# Patient Record
Sex: Female | Born: 1980 | Race: Black or African American | Hispanic: No | Marital: Single | Smoking: Never smoker
Health system: Southern US, Community
[De-identification: ages and names within clinical notes are randomized; demographics above are authoritative.]

## PROBLEM LIST (undated history)

## (undated) DIAGNOSIS — T4145XA Adverse effect of unspecified anesthetic, initial encounter: Secondary | ICD-10-CM

## (undated) DIAGNOSIS — R51 Headache: Secondary | ICD-10-CM

## (undated) DIAGNOSIS — T8859XA Other complications of anesthesia, initial encounter: Secondary | ICD-10-CM

## (undated) DIAGNOSIS — G4700 Insomnia, unspecified: Secondary | ICD-10-CM

## (undated) DIAGNOSIS — R112 Nausea with vomiting, unspecified: Secondary | ICD-10-CM

## (undated) DIAGNOSIS — Z9889 Other specified postprocedural states: Secondary | ICD-10-CM

## (undated) DIAGNOSIS — M8700 Idiopathic aseptic necrosis of unspecified bone: Secondary | ICD-10-CM

## (undated) HISTORY — PX: CHOLECYSTECTOMY: SHX55

## (undated) HISTORY — PX: TUBAL LIGATION: SHX77

## (undated) HISTORY — PX: APPENDECTOMY: SHX54

---

## 2006-06-05 ENCOUNTER — Emergency Department (HOSPITAL_COMMUNITY): Admission: EM | Admit: 2006-06-05 | Discharge: 2006-06-05 | Payer: Self-pay | Admitting: *Deleted

## 2007-07-05 ENCOUNTER — Encounter (INDEPENDENT_AMBULATORY_CARE_PROVIDER_SITE_OTHER): Payer: Self-pay | Admitting: Obstetrics and Gynecology

## 2007-07-05 ENCOUNTER — Ambulatory Visit: Payer: Self-pay | Admitting: Gynecology

## 2007-07-05 ENCOUNTER — Ambulatory Visit (HOSPITAL_COMMUNITY): Admission: RE | Admit: 2007-07-05 | Discharge: 2007-07-05 | Payer: Self-pay | Admitting: Obstetrics and Gynecology

## 2011-03-29 NOTE — Op Note (Signed)
NAMEKENNEDY, BRINES          ACCOUNT NO.:  [id number]   MEDICAL RECORD NO.:  [id number]          PATIENT TYPE:  AMB   LOCATION:  SDC                           FACILITY:  [facility]   PHYSICIAN:  Maxie Better, M.D.DATE OF BIRTH:  [birth year]   DATE OF PROCEDURE:  07/05/2007  DATE OF DISCHARGE:                               OPERATIVE REPORT   PREOPERATIVE DIAGNOSIS:  Persistent right lower quadrant pain.   PROCEDURE:  Diagnostic laparoscopy, laparoscopic appendectomy.   POSTOPERATIVE DIAGNOSIS:  Right lower quadrant pain.   ANESTHESIA:  General.   SURGEON:  Maxie Better, M.D.   ASSISTANT:  Ginger Carne, M.D.   INDICATIONS:  A 30 year old gravida 1, para 1 black female with last  menstrual period 2 days ago, history of tubal ligation with persistent  right lower quadrant pain which she had evaluation including a sonogram  as well as the CT scan x2 and evaluation by general surgeon who now  presents for surgical evaluation. Risk and benefit procedure is  explained to the patient including possible to removal of her appendix.  Consent was the patient was transferred to the operating room.   PROCEDURE:  Under adequate general anesthesia the patient is placed in  the dorsal lithotomy position.  Sterilely prepped and draped in usual  fashion.  The bladder catheterized for moderate amount of urine. A  bivalve was placed in the vagina.  Single-tooth tenaculum placed on the  anterior lip of the cervix and acorn cannula was introduced into the  cervical os and attached to the tenaculum for manipulation of the  uterus.  The bivalve speculum then removed.  Attention was then turned  to the abdomen. 0.25% Marcaine was injected infraumbilically.  An  infraumbilical incision was then made.  Veress needle was introduced,  tested, opening pressure of 2 was noted and 2.5 liters of carbon dioxide  was insufflated.  Veress needle was then removed.  A 10 mm disposable  trocar was  introduced into the abdomen without incident.  A lighted  video scope was introduced through that port. Suprapubic incision was  then made.  The 5 mm port was replaced under direct visualization into  the abdomen was without incident.  A probe was then utilized inferiorly  to assess the pelvis and upper abdomen.  The uterus was pushed  anteriorly. Posterior cul-de-sac, anterior cul-de-sac was without any  evidence of endometriosis.  Both ovaries were normal mobile.  No  evidence of herniation near the inguinal ligament area.  Both tubes  showed evidence of prior surgical interruption. Attention was then  turned to the upper abdomen and normal liver edge was seen.  The  appendix noted to its tip. At that point given the patient's ongoing  pain, decision made to remove the appendix in event that she may have  some acute appendicitis.  Dr. Mart Piggs assistance in this matter was  then obtained. At point he who had been in room and seen on video the  pelvis scrubbed in.  The suprapubic port was removed.  Two 5 mm ports  were placed in the left mid axillary line starting parallel to  umbilicus. The 5 mm ports were placed under direct visualization. The  appendix was grasped. The appendiceal artery was sequentially cauterized  and cut.  The vessel bleeding was cauterized, taking care not to damage  the actual appendix.  When the blood supply to the appendix was  ascertained the 0 PDS sutures were then placed at the base x3, two  proximally and one distally and the intervening segment was then cut.  The appendix was then placed in the Endobag and removed through the  umbilical port.  The pelvis was then irrigated and the fluid suctioned,  stump reinspected.  Good hemostasis remained. No spillage noted at which  time the procedure was felt to been complete. The pelvis was drained as  well. All inferior ports were then removed under direct visualization.  The infraumbilical port was also removed.   The abdomen deflated. The  infraumbilical site was closed with 0 Vicryl fascial stitch and  approximated 4-0 Vicryl subcuticular stitch.  The other sites were  closed with Dermabond.  The instruments in the vagina were removed.  Specimen was appendix sent to pathology with the specific of ruling out  subclinical appendicitis.  Estimated blood loss was minimal.  Complications none.  The patient tolerated the procedure well and was  transferred to recovery in stable condition.      Maxie Better, M.D.  Electronically Signed     [place]/MEDQ  D:  07/05/2007  T:  07/05/2007  Job:  [id number]

## 2011-06-16 ENCOUNTER — Encounter: Payer: Self-pay | Admitting: Podiatry

## 2011-08-26 LAB — CBC
HCT: 41.1
Hemoglobin: 14.3
RBC: 4.67
RDW: 12.8
WBC: 5.1

## 2012-07-12 ENCOUNTER — Other Ambulatory Visit: Payer: Self-pay | Admitting: Obstetrics & Gynecology

## 2012-07-12 ENCOUNTER — Other Ambulatory Visit (HOSPITAL_COMMUNITY)
Admission: RE | Admit: 2012-07-12 | Discharge: 2012-07-12 | Disposition: A | Discharge status: Home / Self Care | Payer: BC Managed Care – PPO | Source: Ambulatory Visit | Attending: Obstetrics & Gynecology | Admitting: Obstetrics & Gynecology

## 2012-07-12 DIAGNOSIS — Z1151 Encounter for screening for human papillomavirus (HPV): Secondary | ICD-10-CM | POA: Insufficient documentation

## 2012-07-12 DIAGNOSIS — Z124 Encounter for screening for malignant neoplasm of cervix: Secondary | ICD-10-CM | POA: Insufficient documentation

## 2012-07-12 DIAGNOSIS — Z113 Encounter for screening for infections with a predominantly sexual mode of transmission: Secondary | ICD-10-CM | POA: Insufficient documentation

## 2012-07-19 ENCOUNTER — Other Ambulatory Visit: Payer: Self-pay | Admitting: Obstetrics & Gynecology

## 2013-03-26 ENCOUNTER — Encounter (HOSPITAL_COMMUNITY): Payer: Self-pay | Admitting: Pharmacy Technician

## 2013-03-26 NOTE — Progress Notes (Signed)
Karen Macias/ Dr Charlann Boxer-   Need  PRE OP ORDERS PLEASE-  Has  PST appt 03/28/13   Thanks

## 2013-03-27 ENCOUNTER — Other Ambulatory Visit (HOSPITAL_COMMUNITY): Payer: Self-pay | Admitting: *Deleted

## 2013-03-28 ENCOUNTER — Encounter (HOSPITAL_COMMUNITY): Payer: Self-pay

## 2013-03-28 ENCOUNTER — Encounter (HOSPITAL_COMMUNITY)
Admission: RE | Admit: 2013-03-28 | Discharge: 2013-03-28 | Disposition: A | Discharge status: Home / Self Care | Payer: BC Managed Care – PPO | Source: Ambulatory Visit | Attending: Orthopedic Surgery | Admitting: Orthopedic Surgery

## 2013-03-28 DIAGNOSIS — M87059 Idiopathic aseptic necrosis of unspecified femur: Secondary | ICD-10-CM | POA: Insufficient documentation

## 2013-03-28 DIAGNOSIS — Z0183 Encounter for blood typing: Secondary | ICD-10-CM | POA: Insufficient documentation

## 2013-03-28 DIAGNOSIS — Z01812 Encounter for preprocedural laboratory examination: Secondary | ICD-10-CM | POA: Insufficient documentation

## 2013-03-28 HISTORY — DX: Idiopathic aseptic necrosis of unspecified bone: M87.00

## 2013-03-28 HISTORY — DX: Insomnia, unspecified: G47.00

## 2013-03-28 HISTORY — DX: Headache: R51

## 2013-03-28 LAB — URINALYSIS, ROUTINE W REFLEX MICROSCOPIC
Hgb urine dipstick: NEGATIVE
Leukocytes, UA: NEGATIVE
Protein, ur: NEGATIVE mg/dL
pH: 6 (ref 5.0–8.0)

## 2013-03-28 LAB — CBC
HCT: 38.7 % (ref 36.0–46.0)
MCH: 28.9 pg (ref 26.0–34.0)
MCHC: 34.1 g/dL (ref 30.0–36.0)
Platelets: 275 10*3/uL (ref 150–400)
WBC: 9.1 10*3/uL (ref 4.0–10.5)

## 2013-03-28 LAB — BASIC METABOLIC PANEL
CO2: 26 mEq/L (ref 19–32)
Calcium: 9.2 mg/dL (ref 8.4–10.5)
Chloride: 101 mEq/L (ref 96–112)
Creatinine, Ser: 0.55 mg/dL (ref 0.50–1.10)
Glucose, Bld: 85 mg/dL (ref 70–99)
Potassium: 3.7 mEq/L (ref 3.5–5.1)

## 2013-03-28 LAB — PROTIME-INR
INR: 1.02 (ref 0.00–1.49)
Prothrombin Time: 13.3 seconds (ref 11.6–15.2)

## 2013-03-28 LAB — HCG, SERUM, QUALITATIVE: Preg, Serum: NEGATIVE

## 2013-03-28 NOTE — Patient Instructions (Addendum)
Jakalyn Polinski  03/28/2013                           YOUR PROCEDURE IS SCHEDULED ON: 04/05/13               PLEASE REPORT TO SHORT STAY CENTER AT : 1:00 PM               CALL THIS NUMBER IF ANY PROBLEMS THE DAY OF SURGERY :               [phone]                      REMEMBER:   Do not eat food or drink liquids AFTER MIDNIGHT  May have clear liquids UNTIL 6 HOURS BEFORE SURGERY (10:00 AM)  Clear liquids include soda, tea, black coffee, apple or grape juice, broth.  Take these medicines the morning of surgery with A SIP OF WATER: TRAMADOL IF NEEDED   Do not wear jewelry, make-up   Do not wear lotions, powders, or perfumes.   Do not shave legs or underarms 12 hrs. before surgery (men may shave face)  Do not bring valuables to the hospital.  Contacts, dentures or bridgework may not be worn into surgery.  Leave suitcase in the car. After surgery it may be brought to your room.  For patients admitted to the hospital more than one night, checkout time is 11:00                          The day of discharge.   Patients discharged the day of surgery will not be allowed to drive home                             If going home same day of surgery, must have someone stay with you first                           24 hrs at home and arrange for some one to drive you home from hospital.    Special Instructions:   Please read over the following fact sheets that you were given:               1. MRSA  INFORMATION                      2. [place] PREPARING FOR SURGERY SHEET               3. INCENTIVE SPIROMETER                                                X_____________________________________________________________________        Failure to follow these instructions may result in cancellation of your surgery

## 2013-04-05 ENCOUNTER — Ambulatory Visit (HOSPITAL_COMMUNITY): Payer: BC Managed Care – PPO

## 2013-04-05 ENCOUNTER — Inpatient Hospital Stay (HOSPITAL_COMMUNITY)
Admission: RE | Admit: 2013-04-05 | Discharge: 2013-04-08 | DRG: 230 | Disposition: A | Discharge status: Home Health | Payer: BC Managed Care – PPO | Source: Ambulatory Visit | Attending: Orthopedic Surgery | Admitting: Orthopedic Surgery

## 2013-04-05 ENCOUNTER — Encounter (HOSPITAL_COMMUNITY)
Admission: RE | Disposition: A | Discharge status: Home Health | Payer: Self-pay | Source: Ambulatory Visit | Attending: Orthopedic Surgery

## 2013-04-05 ENCOUNTER — Encounter (HOSPITAL_COMMUNITY): Payer: Self-pay | Admitting: *Deleted

## 2013-04-05 ENCOUNTER — Ambulatory Visit (HOSPITAL_COMMUNITY): Payer: BC Managed Care – PPO | Admitting: Anesthesiology

## 2013-04-05 ENCOUNTER — Encounter (HOSPITAL_COMMUNITY): Payer: Self-pay | Admitting: Anesthesiology

## 2013-04-05 DIAGNOSIS — Z79899 Other long term (current) drug therapy: Secondary | ICD-10-CM

## 2013-04-05 DIAGNOSIS — M87051 Idiopathic aseptic necrosis of right femur: Secondary | ICD-10-CM

## 2013-04-05 DIAGNOSIS — Z6839 Body mass index (BMI) 39.0-39.9, adult: Secondary | ICD-10-CM

## 2013-04-05 DIAGNOSIS — R5381 Other malaise: Secondary | ICD-10-CM | POA: Diagnosis present

## 2013-04-05 DIAGNOSIS — M87059 Idiopathic aseptic necrosis of unspecified femur: Principal | ICD-10-CM | POA: Diagnosis present

## 2013-04-05 DIAGNOSIS — R5383 Other fatigue: Secondary | ICD-10-CM | POA: Diagnosis present

## 2013-04-05 DIAGNOSIS — Z01812 Encounter for preprocedural laboratory examination: Secondary | ICD-10-CM

## 2013-04-05 HISTORY — PX: DECOMPRESSION HIP-CORE: SHX5012

## 2013-04-05 LAB — TYPE AND SCREEN: Antibody Screen: NEGATIVE

## 2013-04-05 SURGERY — CORE DECOMPRESSION, FEMUR, HEAD
Anesthesia: General | Site: Hip | Laterality: Right | Wound class: Clean

## 2013-04-05 MED ORDER — LACTATED RINGERS IV SOLN
INTRAVENOUS | Status: DC | PRN
  Administered 2013-04-05 (×2): via INTRAVENOUS

## 2013-04-05 MED ORDER — SCOPOLAMINE 1 MG/3DAYS TD PT72
MEDICATED_PATCH | TRANSDERMAL | Status: AC
  Filled 2013-04-05: qty 1

## 2013-04-05 MED ORDER — LORATADINE 10 MG PO TABS
10.0000 mg | ORAL_TABLET | Freq: Every day | ORAL | Status: DC
  Administered 2013-04-06 – 2013-04-08 (×3): 10 mg via ORAL
  Filled 2013-04-05 (×4): qty 1

## 2013-04-05 MED ORDER — ACETAMINOPHEN 10 MG/ML IV SOLN
INTRAVENOUS | Status: AC
  Filled 2013-04-05: qty 100

## 2013-04-05 MED ORDER — KETOROLAC TROMETHAMINE 30 MG/ML IJ SOLN: 15.0000 mg | Freq: Once | INTRAMUSCULAR | Status: DC | PRN

## 2013-04-05 MED ORDER — LIDOCAINE HCL (CARDIAC) 20 MG/ML IV SOLN
INTRAVENOUS | Status: DC | PRN
  Administered 2013-04-05: 50 mg via INTRAVENOUS

## 2013-04-05 MED ORDER — ASPIRIN EC 325 MG PO TBEC: 325.0000 mg | DELAYED_RELEASE_TABLET | Freq: Two times a day (BID) | ORAL | Status: DC

## 2013-04-05 MED ORDER — METHOCARBAMOL 500 MG PO TABS: 500.0000 mg | ORAL_TABLET | Freq: Four times a day (QID) | ORAL | Status: DC

## 2013-04-05 MED ORDER — ONDANSETRON HCL 4 MG/2ML IJ SOLN
INTRAMUSCULAR | Status: DC | PRN
  Administered 2013-04-05: 4 mg via INTRAVENOUS

## 2013-04-05 MED ORDER — ONDANSETRON HCL 4 MG/2ML IJ SOLN
4.0000 mg | Freq: Four times a day (QID) | INTRAMUSCULAR | Status: DC | PRN
  Administered 2013-04-05: 4 mg via INTRAVENOUS
  Filled 2013-04-05: qty 2

## 2013-04-05 MED ORDER — PROPOFOL 10 MG/ML IV BOLUS
INTRAVENOUS | Status: DC | PRN
  Administered 2013-04-05: 200 mg via INTRAVENOUS

## 2013-04-05 MED ORDER — CEFAZOLIN SODIUM-DEXTROSE 2-3 GM-% IV SOLR
2.0000 g | INTRAVENOUS | Status: AC
  Administered 2013-04-05: 2 g via INTRAVENOUS

## 2013-04-05 MED ORDER — FENTANYL CITRATE 0.05 MG/ML IJ SOLN
INTRAMUSCULAR | Status: DC | PRN
  Administered 2013-04-05: 100 ug via INTRAVENOUS
  Administered 2013-04-05: 50 ug via INTRAVENOUS
  Administered 2013-04-05: 100 ug via INTRAVENOUS

## 2013-04-05 MED ORDER — HYDROMORPHONE HCL PF 1 MG/ML IJ SOLN
INTRAMUSCULAR | Status: AC
  Filled 2013-04-05: qty 1

## 2013-04-05 MED ORDER — DEXAMETHASONE SODIUM PHOSPHATE 4 MG/ML IJ SOLN
INTRAMUSCULAR | Status: DC | PRN
  Administered 2013-04-05: 4 mg via INTRAVENOUS

## 2013-04-05 MED ORDER — SCOPOLAMINE 1 MG/3DAYS TD PT72
MEDICATED_PATCH | TRANSDERMAL | Status: DC | PRN
  Administered 2013-04-05: 1 via TRANSDERMAL

## 2013-04-05 MED ORDER — 0.9 % SODIUM CHLORIDE (POUR BTL) OPTIME
TOPICAL | Status: DC | PRN
  Administered 2013-04-05: 1000 mL

## 2013-04-05 MED ORDER — ONDANSETRON HCL 4 MG PO TABS
4.0000 mg | ORAL_TABLET | Freq: Four times a day (QID) | ORAL | Status: DC | PRN
  Administered 2013-04-08: 4 mg via ORAL
  Filled 2013-04-05: qty 1

## 2013-04-05 MED ORDER — METOCLOPRAMIDE HCL 5 MG/ML IJ SOLN
5.0000 mg | Freq: Three times a day (TID) | INTRAMUSCULAR | Status: DC | PRN
  Administered 2013-04-05 – 2013-04-06 (×2): 10 mg via INTRAVENOUS
  Filled 2013-04-05 (×3): qty 2

## 2013-04-05 MED ORDER — HYDROMORPHONE HCL PF 1 MG/ML IJ SOLN
INTRAMUSCULAR | Status: DC | PRN
  Administered 2013-04-05: 2 mg via INTRAVENOUS

## 2013-04-05 MED ORDER — HYDROMORPHONE HCL PF 1 MG/ML IJ SOLN
0.2500 mg | INTRAMUSCULAR | Status: DC | PRN
  Administered 2013-04-05 (×4): 0.5 mg via INTRAVENOUS

## 2013-04-05 MED ORDER — METOCLOPRAMIDE HCL 10 MG PO TABS: 5.0000 mg | ORAL_TABLET | Freq: Three times a day (TID) | ORAL | Status: DC | PRN

## 2013-04-05 MED ORDER — CHLORHEXIDINE GLUCONATE 4 % EX LIQD: 60.0000 mL | Freq: Once | CUTANEOUS | Status: DC

## 2013-04-05 MED ORDER — METOCLOPRAMIDE HCL 5 MG/ML IJ SOLN
INTRAMUSCULAR | Status: DC | PRN
  Administered 2013-04-05: 10 mg via INTRAVENOUS

## 2013-04-05 MED ORDER — HYDROCODONE-ACETAMINOPHEN 5-325 MG PO TABS
1.0000 | ORAL_TABLET | ORAL | Status: DC | PRN
  Administered 2013-04-05: 2 via ORAL
  Administered 2013-04-05: 1 via ORAL
  Administered 2013-04-06 – 2013-04-08 (×9): 2 via ORAL
  Filled 2013-04-05 (×11): qty 2

## 2013-04-05 MED ORDER — HYDROCODONE-ACETAMINOPHEN 5-325 MG PO TABS: 1.0000 | ORAL_TABLET | ORAL | Status: DC | PRN

## 2013-04-05 MED ORDER — MIDAZOLAM HCL 5 MG/5ML IJ SOLN
INTRAMUSCULAR | Status: DC | PRN
  Administered 2013-04-05: 2 mg via INTRAVENOUS

## 2013-04-05 MED ORDER — ASPIRIN EC 325 MG PO TBEC
325.0000 mg | DELAYED_RELEASE_TABLET | Freq: Two times a day (BID) | ORAL | Status: DC
  Administered 2013-04-06 – 2013-04-08 (×5): 325 mg via ORAL
  Filled 2013-04-05 (×6): qty 1

## 2013-04-05 MED ORDER — PROMETHAZINE HCL 25 MG/ML IJ SOLN: 6.2500 mg | INTRAMUSCULAR | Status: DC | PRN

## 2013-04-05 MED ORDER — CEFAZOLIN SODIUM-DEXTROSE 2-3 GM-% IV SOLR
INTRAVENOUS | Status: AC
  Filled 2013-04-05: qty 50

## 2013-04-05 MED ORDER — SODIUM CHLORIDE 0.9 % IV SOLN
INTRAVENOUS | Status: DC
  Administered 2013-04-05: 19:00:00 via INTRAVENOUS
  Administered 2013-04-06: 50 mL/h via INTRAVENOUS

## 2013-04-05 MED ORDER — LACTATED RINGERS IV SOLN
INTRAVENOUS | Status: DC
  Administered 2013-04-05: 1000 mL via INTRAVENOUS

## 2013-04-05 SURGICAL SUPPLY — 43 items
BAG SPEC THK2 15X12 ZIP CLS (MISCELLANEOUS)
BAG ZIPLOCK 12X15 (MISCELLANEOUS) IMPLANT
CLOTH BEACON ORANGE TIMEOUT ST (SAFETY) ×2 IMPLANT
DERMABOND ADVANCED (GAUZE/BANDAGES/DRESSINGS) ×1
DERMABOND ADVANCED .7 DNX12 (GAUZE/BANDAGES/DRESSINGS) ×1 IMPLANT
DRAPE ORTHO SPLIT 77X108 STRL (DRAPES)
DRAPE POUCH INSTRU U-SHP 10X18 (DRAPES) ×2 IMPLANT
DRAPE STERI IOBAN 125X83 (DRAPES) ×2 IMPLANT
DRAPE SURG 17X11 SM STRL (DRAPES) IMPLANT
DRAPE SURG ORHT 6 SPLT 77X108 (DRAPES) IMPLANT
DRAPE U-SHAPE 47X51 STRL (DRAPES) IMPLANT
DRSG AQUACEL AG ADV 3.5X 6 (GAUZE/BANDAGES/DRESSINGS) ×2 IMPLANT
DRSG AQUACEL AG ADV 3.5X10 (GAUZE/BANDAGES/DRESSINGS) IMPLANT
DRSG TEGADERM 4X4.75 (GAUZE/BANDAGES/DRESSINGS) IMPLANT
DURAPREP 26ML APPLICATOR (WOUND CARE) ×2 IMPLANT
ELECT REM PT RETURN 9FT ADLT (ELECTROSURGICAL) ×2
ELECTRODE REM PT RTRN 9FT ADLT (ELECTROSURGICAL) ×1 IMPLANT
EVACUATOR 1/8 PVC DRAIN (DRAIN) IMPLANT
GAUZE SPONGE 2X2 8PLY STRL LF (GAUZE/BANDAGES/DRESSINGS) IMPLANT
GLOVE BIOGEL PI IND STRL 7.5 (GLOVE) ×1 IMPLANT
GLOVE BIOGEL PI IND STRL 8 (GLOVE) ×1 IMPLANT
GLOVE BIOGEL PI INDICATOR 7.5 (GLOVE) ×1
GLOVE BIOGEL PI INDICATOR 8 (GLOVE) ×1
GLOVE ECLIPSE 8.0 STRL XLNG CF (GLOVE) ×2 IMPLANT
GLOVE ORTHO TXT STRL SZ7.5 (GLOVE) ×6 IMPLANT
GLOVE SURG SS PI 7.5 STRL IVOR (GLOVE) ×4 IMPLANT
GOWN BRE IMP PREV XXLGXLNG (GOWN DISPOSABLE) IMPLANT
GOWN STRL NON-REIN LRG LVL3 (GOWN DISPOSABLE) IMPLANT
KIT BASIN OR (CUSTOM PROCEDURE TRAY) ×2 IMPLANT
MANIFOLD NEPTUNE II (INSTRUMENTS) ×2 IMPLANT
NS IRRIG 1000ML POUR BTL (IV SOLUTION) ×2 IMPLANT
PACK TOTAL JOINT (CUSTOM PROCEDURE TRAY) IMPLANT
PIN THREADED GUIDE ACE (PIN) ×2 IMPLANT
POSITIONER SURGICAL ARM (MISCELLANEOUS) ×4 IMPLANT
SPONGE GAUZE 2X2 STER 10/PKG (GAUZE/BANDAGES/DRESSINGS)
SUT MNCRL AB 4-0 PS2 18 (SUTURE) ×2 IMPLANT
SUT VIC AB 0 CT1 27 (SUTURE) ×1
SUT VIC AB 0 CT1 27XBRD ANTBC (SUTURE) ×1 IMPLANT
SUT VIC AB 1 CT1 36 (SUTURE) ×2 IMPLANT
SUT VIC AB 2-0 CT1 27 (SUTURE) ×1
SUT VIC AB 2-0 CT1 27XBRD (SUTURE) ×1 IMPLANT
TOWEL OR 17X26 10 PK STRL BLUE (TOWEL DISPOSABLE) ×2 IMPLANT
TRAY FOLEY CATH 14FRSI W/METER (CATHETERS) IMPLANT

## 2013-04-05 NOTE — Transfer of Care (Signed)
Immediate Anesthesia Transfer of Care Note  Patient: Karen Macias  Procedure(s) Performed: Procedure(s): RIGHT HIP CORE - DECOMPRESSION (Right)  Patient Location: PACU  Anesthesia Type:General  Level of Consciousness: awake and alert   Airway & Oxygen Therapy: Patient Spontanous Breathing and Patient connected to face mask oxygen  Post-op Assessment: Report given to PACU RN and Post -op Vital signs reviewed and stable  Post vital signs: Reviewed and stable  Complications: No apparent anesthesia complications

## 2013-04-05 NOTE — Anesthesia Postprocedure Evaluation (Signed)
Anesthesia Post Note  Patient: Engineer, drilling  Procedure(s) Performed: Procedure(s) (LRB): RIGHT HIP CORE - DECOMPRESSION (Right)  Anesthesia type: General  Patient location: PACU  Post pain: Pain level controlled  Post assessment: Post-op Vital signs reviewed  Last Vitals: BP 126/57  Pulse 79  Temp(Src) 36.6 C (Oral)  Resp 12  SpO2 100%  LMP 03/08/2013  Post vital signs: Reviewed  Level of consciousness: sedated  Complications: No apparent anesthesia complications
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
## 2013-04-05 NOTE — Brief Op Note (Signed)
04/05/2013  3:38 PM  PATIENT:  Tonia Ghent  32 y.o. female  PRE-OPERATIVE DIAGNOSIS:  Right Hip Avascular Necrosis  POST-OPERATIVE DIAGNOSIS:  RIGHT HIP AVASCULAR  NECROSIS without collapse  PROCEDURE:  Procedure(s): RIGHT HIP CORE - DECOMPRESSION (Right)  SURGEON:  Surgeon(s) and Role:    * Shelda Pal, MD - Primary  PHYSICIAN ASSISTANT: Lanney Gins, PA-C  ANESTHESIA:   general  EBL:     BLOOD ADMINISTERED:none  DRAINS: none   LOCAL MEDICATIONS USED:  NONE  SPECIMEN:  No Specimen  DISPOSITION OF SPECIMEN:  N/A  COUNTS:  YES  TOURNIQUET:  * No tourniquets in log *  DICTATION: .Other Dictation: Dictation Number [id number]  PLAN OF CARE: Admit for overnight observation  PATIENT DISPOSITION:  PACU - hemodynamically stable.   Delay start of Pharmacological VTE agent (>24hrs) due to surgical blood loss or risk of bleeding: not applicable

## 2013-04-05 NOTE — Op Note (Signed)
Karen Macias, Karen Macias          ACCOUNT NO.:  [id number]  MEDICAL RECORD NO.:  [id number]  LOCATION:  1605                         FACILITY:  [facility]  PHYSICIAN:  Madlyn Frankel. Charlann Boxer, M.D.  DATE OF BIRTH:  1981-01-18  DATE OF PROCEDURE:  04/05/2013 DATE OF DISCHARGE:                              OPERATIVE REPORT   PREOPERATIVE DIAGNOSIS:  Right hip avascular necrosis without collapse.  POSTOPERATIVE DIAGNOSIS:  Right hip avascular necrosis without collapse.  PROCEDURE:  Right hip core decompression.  SURGEON:  Madlyn Frankel. Charlann Boxer, M.D.  ASSISTANT:  Lanney Gins, PA-C.  Note that, Mr. Karen Macias was present for the entirety of the case from preoperative positioning, management of soft tissues throughout the case, primary wound closure, and general facilitation of the case.  ANESTHESIA:  General.  SPECIMENS:  None.  DRAINS:  None.  COMPLICATIONS:  None apparent.  BLOOD LOSS:  Less than 25 mL.  INDICATIONS FOR PROCEDURE:  Karen Macias is a 32 year old female who presented to the office for evaluation of right hip pain.  She had, had an injury where she slipped and fell.  She had radiographic evaluation and had indicated concerns for a lesion of avascular necrosis on the anterior and lateral aspects of the femoral head.  Radiographically on plain films, there was no evidence of collapse, but MRI had indicated this small lesion, avascular necrosis.  After reviewing with her treatment options of observation versus surgical treatment, she wished at this point to proceed with surgical intervention.  Given the fact she did not have collapse and relatively small associated lesion probably 25% of the femoral head or less, we discussed core decompression versus some sort of revascularized fibular graft and definitely total hip arthroplasty.  Risks and benefits of the procedure were discussed and minimal risks of infection, DVT, more important was discussion of potential progression of  arthritis potentially for future surgery including total hip arthroplasty. Consent was obtained for benefit of pain relief.  PROCEDURE IN DETAIL:  The patient was brought to the operative theater. Once adequate anesthesia, preoperative antibiotics administered, Ancef. She was positioned supine on the fracture table.  Her left hip was flexed and abducted out of the way with bony prominences padded.  The right foot was placed into traction boot.  There was no traction applied.  At this point once she was adequately positioned with bony prominences padded, the right hip was prepped and draped in a sterile fashion from the iliac crest to the knee area.  The shower curtain dressing was applied.  At this point, we used fluoroscopy to identify the landmarks of the hip.  This was confirmed on AP and lateral.  Once the radiographs were obtained, I at this point used a guidewire in set orientation.  Landmarks identified in her hip.  Karen Macias has a very large lateral hip mass of soft tissue and fat.  I then made an incision laterally on the thigh and placed a guide pin along the lateral cortex at the level just proximal to the lesser trochanter.  Then under fluoroscopic imaging, I was careful to place the guidewire into the anterior, lateral aspect of her femoral head.  We had MRI images upon the screen to identify  the location and confirm.  Once I had the guidewire inserted in this area, we used a reamer for DHS screw, which was about 9 mm in diameter.  I then used a guidewire and drilled under fluoroscopy including continuous fluoroscopy to prevent any complications with the pin penetration.  Once this was cored, I took radiographs of the pictures of the core and location.  I then removed the drill and I then confirmed that there was no penetration using the guide wire.  Once this was done and this area decompressed, I irrigated the wound.  I then placed a single stitch into the deep  iliotibial band fascia and then following that, I reapproximated the skin edges using 2- 0 Vicryl running, 4-0 Monocryl.  The hip was then cleaned, dried, and dressed sterilely using Dermabond and Aquacel dressing.  She was then awoken from anesthesia and transferred to the recovery room in stable condition tolerating the procedure well.  She will be partial weightbearing until followup to make certain that she continues to get some healing response.  If there is persistent recurring problems, followup MRI will be required.     Madlyn Frankel Charlann Boxer, M.D.     MDO/MEDQ  D:  04/05/2013  T:  04/05/2013  Job:  [id number]

## 2013-04-05 NOTE — Anesthesia Preprocedure Evaluation (Addendum)
Anesthesia Evaluation  Patient identified by MRN, date of birth, ID band Patient awake    Reviewed: Allergy & Precautions, H&P , NPO status , Patient's Chart, lab work & pertinent test results  Airway Mallampati: III TM Distance: <3 FB Neck ROM: Full    Dental no notable dental hx. (+) Dental Advisory Given   Pulmonary neg pulmonary ROS,  breath sounds clear to auscultation  Pulmonary exam normal       Cardiovascular negative cardio ROS  Rhythm:Regular Rate:Normal     Neuro/Psych negative neurological ROS  negative psych ROS   GI/Hepatic negative GI ROS, Neg liver ROS,   Endo/Other  Morbid obesity  Renal/GU negative Renal ROS  negative genitourinary   Musculoskeletal negative musculoskeletal ROS (+)   Abdominal   Peds negative pediatric ROS (+)  Hematology negative hematology ROS (+)   Anesthesia Other Findings   Reproductive/Obstetrics negative OB ROS                          Anesthesia Physical Anesthesia Plan  ASA: II  Anesthesia Plan: General   Post-op Pain Management:    Induction: Intravenous  Airway Management Planned: Oral ETT  Additional Equipment:   Intra-op Plan:   Post-operative Plan: Extubation in OR  Informed Consent: I have reviewed the patients History and Physical, chart, labs and discussed the procedure including the risks, benefits and alternatives for the proposed anesthesia with the patient or authorized representative who has indicated his/her understanding and acceptance.   Dental advisory given  Plan Discussed with: CRNA and Surgeon  Anesthesia Plan Comments:         Anesthesia Quick Evaluation

## 2013-04-05 NOTE — Interval H&P Note (Signed)
History and Physical Interval Note:  04/05/2013 2:39 PM  Karen Macias  has presented today for surgery, with the diagnosis of Right Hip Avascular Necrosis  The various methods of treatment have been discussed with the patient and family. After consideration of risks, benefits and other options for treatment, the patient has consented to  Procedure(s): RIGHT HIP CORE - DECOMPRESSION (Right) as a surgical intervention .  The patient's history has been reviewed, patient examined, no change in status, stable for surgery.  I have reviewed the patient's chart and labs.  Questions were answered to the patient's satisfaction.     Shelda Pal

## 2013-04-06 MED ORDER — OXYCODONE HCL 5 MG PO TABS
5.0000 mg | ORAL_TABLET | ORAL | Status: DC | PRN
  Administered 2013-04-06 – 2013-04-08 (×6): 5 mg via ORAL
  Filled 2013-04-06 (×6): qty 1

## 2013-04-06 MED ORDER — POLYETHYLENE GLYCOL 3350 17 G PO PACK
17.0000 g | PACK | Freq: Two times a day (BID) | ORAL | Status: DC
  Administered 2013-04-06 – 2013-04-07 (×3): 17 g via ORAL

## 2013-04-06 NOTE — Evaluation (Signed)
Physical Therapy Evaluation Patient Details Name: Karen Macias MRN: [id number] DOB: [birth year] Today's Date: 04/06/2013 Time: 4098-1191 PT Time Calculation (min): 23 min  PT Assessment / Plan / Recommendation Clinical Impression  pt is s/o right hip core decompression and will benefit from PT to maximize independence for return home with family.      PT Assessment  Patient needs continued PT services    Follow Up Recommendations  Home health PT    Does the patient have the potential to tolerate intense rehabilitation      Barriers to Discharge        Equipment Recommendations  Rolling walker with 5" wheels    Recommendations for Other Services     Frequency 7X/week    Precautions / Restrictions Restrictions RLE Weight Bearing: Partial weight bearing RLE Partial Weight Bearing Percentage or Pounds: 50%   Pertinent Vitals/Pain "tolerable" pain      Mobility  Bed Mobility Bed Mobility: Supine to Sit Details for Bed Mobility Assistance: min with RLE, cues for technique Transfers Transfers: Sit to Stand;Stand to Sit Sit to Stand: 4: Min assist Stand to Sit: 4: Min assist Details for Transfer Assistance: cues for hand placement and RLE positioning for comfort Ambulation/Gait Ambulation/Gait Assistance: 4: Min assist Ambulation Distance (Feet): 11 Feet Assistive device: Rolling walker Gait Pattern: Step-to pattern Gait velocity: decreased    Exercises     PT Diagnosis: Difficulty walking  PT Problem List: Decreased strength;Decreased range of motion;Decreased activity tolerance;Decreased balance;Decreased mobility;Decreased knowledge of precautions;Decreased knowledge of use of DME PT Treatment Interventions: DME instruction;Gait training;Functional mobility training;Therapeutic activities;Therapeutic exercise   PT Goals Acute Rehab PT Goals PT Goal Formulation: With patient Time For Goal Achievement: 04/13/13 Potential to Achieve Goals: Good Pt will go  Supine/Side to Sit: with supervision PT Goal: Supine/Side to Sit - Progress: Goal set today Pt will go Sit to Stand: with modified independence PT Goal: Sit to Stand - Progress: Goal set today Pt will Ambulate: 51 - 150 feet;with supervision;with min assist PT Goal: Ambulate - Progress: Goal set today Pt will Go Up / Down Stairs: 3-5 stairs PT Goal: Up/Down Stairs - Progress: Goal set today  Visit Information  Last PT Received On: 04/06/13 Assistance Needed: +1    Subjective Data  Subjective: I am ok Patient Stated Goal: home   Prior Functioning  Home Living Lives With: Family Available Help at Discharge: Family Type of Home: House Home Access: Stairs to enter Secretary/administrator of Steps: 4 Entrance Stairs-Rails: Right;Left Home Layout: One level Home Adaptive Equipment: Crutches Prior Function Level of Independence: Independent with assistive device(s) Able to Take Stairs?: Yes Communication Communication: No difficulties    Cognition  Cognition Arousal/Alertness: Awake/alert Behavior During Therapy: WFL for tasks assessed/performed Overall Cognitive Status: Within Functional Limits for tasks assessed    Extremity/Trunk Assessment Right Upper Extremity Assessment RUE ROM/Strength/Tone: [facility] for tasks assessed Left Upper Extremity Assessment LUE ROM/Strength/Tone: [facility] for tasks assessed Right Lower Extremity Assessment RLE ROM/Strength/Tone: Deficits;Unable to fully assess;Due to pain RLE ROM/Strength/Tone Deficits: ankle WFL Left Lower Extremity Assessment LLE ROM/Strength/Tone: [facility] for tasks assessed   Balance    End of Session PT - End of Session Activity Tolerance: Patient tolerated treatment well Patient left: in chair Nurse Communication: Mobility status  GP     [facility] 04/06/2013, 1:14 PM

## 2013-04-06 NOTE — Progress Notes (Signed)
04/06/13 1314  PT Visit Information  Last PT Received On 04/06/13  Assistance Needed +1  PT Time Calculation  PT Start Time 1132  PT Stop Time 1159  PT Time Calculation (min) 27 min  Subjective Data  Subjective I am ready  Patient Stated Goal home  Restrictions  RLE Weight Bearing PWB  RLE Partial Weight Bearing Percentage or Pounds 50%  Cognition  Arousal/Alertness Awake/alert  Behavior During Therapy WFL for tasks assessed/performed  Overall Cognitive Status Within Functional Limits for tasks assessed  Bed Mobility  Bed Mobility Sit to Supine  Sit to Supine 4: Min assist  Details for Bed Mobility Assistance min with RLE, cues for technique  Transfers  Transfers Sit to Stand;Stand to Sit  Sit to Stand 4: Min guard;5: Supervision  Stand to Sit 4: Min guard  Details for Transfer Assistance cues for hand placement and RLE positioning for comfort  Ambulation/Gait  Ambulation/Gait Assistance 4: Min guard  Ambulation Distance (Feet) 60 Feet  Assistive device Rolling walker  Ambulation/Gait Assistance Details cues for PWB, sequencing, and RW safety  Gait Pattern Step-to pattern  Gait velocity decreased  General Gait Details pt with mild dizziness intermittently with amb; BP 130/77  General Exercises - Lower Extremity  Ankle Circles/Pumps AROM;Both;10 reps  Heel Slides AAROM;Right;10 reps  PT - End of Session  Activity Tolerance Patient tolerated treatment well;Patient limited by fatigue  Patient left in chair;with call bell/phone within reach  PT - Assessment/Plan  Comments on Treatment Session pt progressing  PT Plan Discharge plan remains appropriate;Frequency remains appropriate  PT Frequency 7X/week  Follow Up Recommendations Home health PT  PT equipment Rolling walker with 5" wheels  Acute Rehab PT Goals  Time For Goal Achievement 04/13/13  Potential to Achieve Goals Good  Pt will go Sit to Stand with modified independence  PT Goal: Sit to Stand - Progress  Progressing toward goal  Pt will Ambulate 51 - 150 feet;with supervision;with min assist  PT Goal: Ambulate - Progress Progressing toward goal  Pt will Go Up / Down Stairs 3-5 stairs;with rail(s);with least restrictive assistive device  PT General Charges  $$ ACUTE PT VISIT 1 Procedure       PT Treatments  $Gait Training 23-37 mins

## 2013-04-06 NOTE — Progress Notes (Signed)
   Subjective: 1 Day Post-Op Procedure(s) (LRB): RIGHT HIP CORE - DECOMPRESSION (Right)  Pt c/o moderate right hip soreness today Also c/o mild malaise Patient reports pain as moderate.  Objective:   VITALS:   Filed Vitals:   04/06/13 0651  BP: 99/60  Pulse: 62  Temp: 97.5 F (36.4 C)  Resp: 16   Right hip incision healing well nv intact distally No rashes or edema Neurologically intact distally  LABS No results found for this basename: HGB, HCT, WBC, PLT,  in the last 72 hours  No results found for this basename: NA, K, BUN, CREATININE, GLUCOSE,  in the last 72 hours   Assessment/Plan: 1 Day Post-Op Procedure(s) (LRB): RIGHT HIP CORE - DECOMPRESSION (Right)  PT/OT Pain control Possible d/c tomorrow depending on progress   [employer], [facility], PA-C  04/06/2013, 7:18 AM

## 2013-04-06 NOTE — Care Management Note (Signed)
    Page 1 of 1   04/06/2013     3:26:04 PM   CARE MANAGEMENT NOTE 04/06/2013  Patient:  Karen Macias, Karen Macias   Account Number:  [id number]  Date Initiated:  04/06/2013  Documentation initiated by:  Lanier Clam  Subjective/Objective Assessment:   ADMITTED W/R HIP AVN.     Action/Plan:   FROM HOME W/DAD.HAS CRUTCHES.HAS PCP,PHARMACY.[facility] ALREADY FOLLOWING FROM DOCTOR'S OFFICE.   Anticipated DC Date:  04/08/2013   Anticipated DC Plan:  HOME W HOME HEALTH SERVICES      DC Planning Services  CM consult      Choice offered to / List presented to:  C-1 Patient           Status of service:  In process, will continue to follow Medicare Important Message given?   (If response is "NO", the following Medicare IM given date fields will be blank) Date Medicare IM given:   Date Additional Medicare IM given:    Discharge Disposition:    Per UR Regulation:    If discussed at Long Length of Stay Meetings, dates discussed:    Comments:  04/06/13 Assata Juncaj RN,BSN NCM WEEKEND [phone] POD#1 R HIP CORE DECOMPRESSION.PATIENT CHOSE [facility] FOR HH.PT-HH.[facility] FOLLOWING.FAXED W/CONFIRMATION TO INTAKE OP NOTE:1 [id number].AWAITING FINAL HH ORDERS.PLEASE FAX D/C ORDER,& SUMMARY @ D/C.

## 2013-04-07 ENCOUNTER — Inpatient Hospital Stay (HOSPITAL_COMMUNITY): Payer: BC Managed Care – PPO

## 2013-04-07 MED ORDER — PANTOPRAZOLE SODIUM 40 MG PO TBEC
40.0000 mg | DELAYED_RELEASE_TABLET | Freq: Every day | ORAL | Status: DC
  Administered 2013-04-07 – 2013-04-08 (×2): 40 mg via ORAL
  Filled 2013-04-07 (×2): qty 1

## 2013-04-07 NOTE — Progress Notes (Signed)
04/07/13 1500  PT Visit Information  Last PT Received On 04/07/13  Assistance Needed +1  PT Time Calculation  PT Start Time 1507  PT Stop Time 1532  PT Time Calculation (min) 25 min  Subjective Data  Subjective I am staying   Patient Stated Goal home  Precautions  Precautions None  Restrictions  RLE Weight Bearing PWB  RLE Partial Weight Bearing Percentage or Pounds 50%  Cognition  Arousal/Alertness Awake/alert  Behavior During Therapy WFL for tasks assessed/performed  Overall Cognitive Status Within Functional Limits for tasks assessed  Bed Mobility  Bed Mobility Sit to Supine  Sit to Supine 4: Min assist  Details for Bed Mobility Assistance min/guard with RLE, cues for technique  Transfers  Transfers Sit to Stand;Stand to Sit;Stand Pivot Transfers  Sit to Stand 5: Supervision;With upper extremity assist  Stand to Sit 5: Supervision;With upper extremity assist  Stand Pivot Transfers 4: Min guard  Details for Transfer Assistance cues for hand placement and RLE positioning for comfort  Ambulation/Gait  Ambulation/Gait Assistance 4: Min guard  Ambulation Distance (Feet) 45 Feet (5)  Assistive device Rolling walker  Ambulation/Gait Assistance Details cues sequencing initially  Gait Pattern Step-to pattern  Gait velocity decreased  General Gait Details pt continues with c/o feeling of heart "racing"; HR 77 at rest and incr to 119 after amb x 8'; HR up to 122 max during activity, returns to low 80s at rest; RN present/aware  PT - End of Session  Activity Tolerance Patient limited by fatigue;Treatment limited secondary to medical complications (Comment);Patient limited by pain  Patient left in bed;with call bell/phone within reach;with family/visitor present  PT - Assessment/Plan  Comments on Treatment Session continued incr HR with activity; pain right hip up to 6/10, not quite time for meds; will see in am, ther ex deferred due to HR and pain;  PT Plan Discharge plan remains  appropriate;Frequency remains appropriate  PT Frequency 7X/week  Follow Up Recommendations Home health PT  PT equipment Rolling walker with 5" wheels  Acute Rehab PT Goals  Time For Goal Achievement 04/13/13  Potential to Achieve Goals Good  Pt will go Sit to Stand with modified independence  PT Goal: Sit to Stand - Progress Progressing toward goal  Pt will Ambulate 51 - 150 feet;with supervision;with min assist  PT Goal: Ambulate - Progress Progressing toward goal  PT General Charges  $$ ACUTE PT VISIT 1 Procedure  PT Treatments  $Gait Training 23-37 mins

## 2013-04-07 NOTE — Progress Notes (Signed)
Physical Therapy Treatment Patient Details Name: Karen Macias MRN: [id number] DOB: [birth year] Today's Date: 04/07/2013 Time: 4098-1191 PT Time Calculation (min): 27 min  PT Assessment / Plan / Recommendation Comments on Treatment Session  pt with c/o chest and back pain and episode of feeling like her heart was racing and dizzinesss  after amb/stairs; HR 114, sats 100%, BP 145/77; RN notified    Follow Up Recommendations  Home health PT     Does the patient have the potential to tolerate intense rehabilitation     Barriers to Discharge        Equipment Recommendations  Rolling walker with 5" wheels    Recommendations for Other Services    Frequency 7X/week   Plan Discharge plan remains appropriate;Frequency remains appropriate    Precautions / Restrictions Restrictions Weight Bearing Restrictions: Yes RLE Weight Bearing: Partial weight bearing RLE Partial Weight Bearing Percentage or Pounds: 50%   Pertinent Vitals/Pain Hip pain R  5/10 RN medicated during session    Mobility  Bed Mobility Bed Mobility: Sit to Supine Supine to Sit: 4: Min guard Details for Bed Mobility Assistance: min/guard with RLE, cues for technique Transfers Transfers: Sit to Stand;Stand to Sit Sit to Stand: 4: Min guard;5: Supervision Stand to Sit: 4: Min guard Details for Transfer Assistance: cues for hand placement and RLE positioning for comfort Ambulation/Gait Ambulation/Gait Assistance: 4: Min guard Ambulation Distance (Feet): 35 Feet Assistive device: Rolling walker Ambulation/Gait Assistance Details: cues for PWB, sequencing, and RW safety Gait Pattern: Step-to pattern Gait velocity: decreased Stairs: Yes Stair Management Technique: One rail Right;With crutches;Forwards;Step to pattern Number of Stairs: 3    Exercises     PT Diagnosis:    PT Problem List:   PT Treatment Interventions:     PT Goals Acute Rehab PT Goals Time For Goal Achievement: 04/13/13 Potential to  Achieve Goals: Good Pt will go Supine/Side to Sit: with supervision PT Goal: Supine/Side to Sit - Progress: Progressing toward goal Pt will go Sit to Stand: with modified independence PT Goal: Sit to Stand - Progress: Progressing toward goal Pt will Ambulate: 51 - 150 feet;with supervision;with min assist PT Goal: Ambulate - Progress: Progressing toward goal Pt will Go Up / Down Stairs: 3-5 stairs;with rail(s);with least restrictive assistive device PT Goal: Up/Down Stairs - Progress: Met  Visit Information  Last PT Received On: 04/07/13 Assistance Needed: +1    Subjective Data  Subjective: my chest has been hurting Patient Stated Goal: home   Cognition  Cognition Arousal/Alertness: Awake/alert Behavior During Therapy: WFL for tasks assessed/performed Overall Cognitive Status: Within Functional Limits for tasks assessed    Balance     End of Session PT - End of Session Activity Tolerance: Patient limited by fatigue;Treatment limited secondary to medical complications (Comment) Patient left: in chair;with call bell/phone within reach Nurse Communication: Other (comment) (hr/cp)   GP     [facility] 04/07/2013, 9:58 AM

## 2013-04-07 NOTE — Progress Notes (Signed)
Pt with c/o intermittent sensation of sob, rapid heart rate & pain between shoulder blades. See VS flow sheet. Will notify PA. Deno Sida, [employer]

## 2013-04-07 NOTE — Progress Notes (Signed)
   Subjective: 2 Days Post-Op Procedure(s) (LRB): RIGHT HIP CORE - DECOMPRESSION (Right) Patient reports pain as mild and moderate.   Patient seen in rounds with Dr. Lequita Halt. Patient is well, but has had some minor complaints of pain in the hip, requiring pain medications and some pain on eating.  Possible reflux.  Try protonix. Patient is ready to go home later today.  Objective: Vital signs in last 24 hours: Temp:  [97.9 F (36.6 C)-98.3 F (36.8 C)] 97.9 F (36.6 C) (05/25 0500) Pulse Rate:  [70-73] 73 (05/25 0500) Resp:  [15-16] 16 (05/25 0500) BP: (117-132)/(71-78) 117/71 mmHg (05/25 0500) SpO2:  [100 %] 100 % (05/25 0500)  Intake/Output from previous day:  Intake/Output Summary (Last 24 hours) at 04/07/13 0735 Last data filed at 04/07/13 0500  Gross per 24 hour  Intake   2145 ml  Output      0 ml  Net   2145 ml    Intake/Output this shift:    Labs: No results found for this basename: HGB,  in the last 72 hours No results found for this basename: WBC, RBC, HCT, PLT,  in the last 72 hours No results found for this basename: NA, K, CL, CO2, BUN, CREATININE, GLUCOSE, CALCIUM,  in the last 72 hours No results found for this basename: LABPT, INR,  in the last 72 hours  EXAM: General - Patient is Alert, Appropriate and Oriented Extremity - Neurovascular intact Sensation intact distally Dorsiflexion/Plantar flexion intact No cellulitis present Incision - clean, dry, no drainage Motor Function - intact, moving foot and toes well on exam.   Assessment/Plan: 2 Days Post-Op Procedure(s) (LRB): RIGHT HIP CORE - DECOMPRESSION (Right) Procedure(s) (LRB): RIGHT HIP CORE - DECOMPRESSION (Right) Past Medical History  Diagnosis Date  . Headache     HISTORY OF MIGRAINES  . Avascular necrosis     RT HIP  . Insomnia    Active Problems:   * No active hospital problems. *  Estimated body mass index is 39.11 kg/(m^2) as calculated from the following:   Height as of this  encounter: 5\' 5"  (1.651 m).   Weight as of this encounter: 106.595 kg (235 lb). Up with therapy Discharge home with home health Diet - Regular diet Follow up - in 2 weeks following the surgery Activity - PWB 50% right leg Disposition - Home Condition Upon Discharge - Stable D/C Meds - See DC Summary DVT Prophylaxis - Aspirin 325 mg daily.   Cleopatra Sardo 04/07/2013, 7:35 AM

## 2013-04-07 NOTE — Progress Notes (Addendum)
Avel Peace, PA, called back & gave order to hold discharge until tomorrow. Reason is due to pt being sob from going from bed to bathroom and back. Shonte Beutler, [employer]

## 2013-04-07 NOTE — Progress Notes (Signed)
Results back from cxr & ekg, both wnl. PA Avel Peace notified by phone. Jariyah Hackley, [employer]

## 2013-04-08 MED ORDER — DOCUSATE SODIUM 100 MG PO CAPS: 100.0000 mg | ORAL_CAPSULE | Freq: Two times a day (BID) | ORAL | Status: DC

## 2013-04-08 MED ORDER — SENNOSIDES 8.6 MG PO TABS: 2.0000 | ORAL_TABLET | Freq: Every day | ORAL | Status: DC

## 2013-04-08 NOTE — Progress Notes (Signed)
Nutrition Brief Note  Patient identified on the Malnutrition Screening Tool (MST) Report  Body mass index is 39.11 kg/(m^2). Patient meets criteria for class II obesity based on current BMI.   Current diet order is low sodium, patient is consuming approximately 50-60% of meals at this time. Labs and medications reviewed. Pt admitted with right hip avascular necrosis and had right hip core decompression 04/05/13. Met with pt who reports good appetite PTA and stable weight. Noted plans for possible d/c today.   No nutrition interventions warranted at this time. If nutrition issues arise, please consult RD.   Karen Hedger MS, RD, LDN [phone] Pager [phone] After Hours Pager

## 2013-04-08 NOTE — Progress Notes (Signed)
Subjective: 3 Days Post-Op Procedure(s) (LRB): RIGHT HIP CORE - DECOMPRESSION (Right) Patient reports pain as moderate.  Pt c/o getting SOB and feeling weak when she gets up to ambulate.  She denies CP.  No h/o heart problems in her or parents.  She feels better promptly upon sitting or lying down.  No n/v.  No BM yet.  EKG yesterday was normal.  Objective: Vital signs in last 24 hours: Temp:  [98.2 F (36.8 C)-98.9 F (37.2 C)] 98.6 F (37 C) (05/26 0500) Pulse Rate:  [79-109] 109 (05/26 0500) Resp:  [18-20] 20 (05/26 0500) BP: (104-119)/(68-76) 119/71 mmHg (05/26 0500) SpO2:  [97 %-100 %] 97 % (05/26 0500)  Intake/Output from previous day: 05/25 0701 - 05/26 0700 In: 480 [P.O.:480] Out: -  Intake/Output this shift: Total I/O In: 240 [P.O.:240] Out: -   No results found for this basename: HGB,  in the last 72 hours No results found for this basename: WBC, RBC, HCT, PLT,  in the last 72 hours No results found for this basename: NA, K, CL, CO2, BUN, CREATININE, GLUCOSE, CALCIUM,  in the last 72 hours No results found for this basename: LABPT, INR,  in the last 72 hours  wn wd woman in nad.  A and O.  Mood and affect normal.  EOMI.  Resp unlabored.  R hip wound dressed and dry.  NVI at R LE.  No calf tenderness.  Assessment/Plan: 3 Days Post-Op Procedure(s) (LRB): RIGHT HIP CORE - DECOMPRESSION (Right) Discharge home with home health   Based on her exam today and her normal vitals overnight, I believe she's ready for discharge home.  She will continue WB 50% on her R LE and f/u with Dr. Charlann Boxer as scheduled.  Colace and senokot for bowel regimen.  Toni Arthurs 04/08/2013, 11:03 AM

## 2013-04-08 NOTE — Progress Notes (Signed)
Physical Therapy Treatment Patient Details Name: Karen Macias MRN: [id number] DOB: 30-May-1981 Today's Date: 04/08/2013 Time: 0272-5366 PT Time Calculation (min): 25 min  PT Assessment / Plan / Recommendation Comments on Treatment Session  pt continues wtih episodes of CP  and feeling of heart "racing"; HR up to 122 with slow paced amb; multiple standing rests during gait due to fatigue and incr HR;     Follow Up Recommendations  Home health PT     Does the patient have the potential to tolerate intense rehabilitation     Barriers to Discharge        Equipment Recommendations  Rolling walker with 5" wheels    Recommendations for Other Services    Frequency 7X/week   Plan Discharge plan remains appropriate;Frequency remains appropriate    Precautions / Restrictions Precautions Precautions: Other (comment) Precaution Comments: monitor HR Restrictions RLE Weight Bearing: Partial weight bearing RLE Partial Weight Bearing Percentage or Pounds: 50   Pertinent Vitals/Pain 5/10 right hip, premedicated    Mobility  Bed Mobility Bed Mobility: Supine to Sit Supine to Sit: 4: Min guard;5: Supervision Details for Bed Mobility Assistance: min/guard with RLE, cues for technique Transfers Transfers: Sit to Stand;Stand to Sit Sit to Stand: 5: Supervision;With upper extremity assist Stand to Sit: 5: Supervision;With upper extremity assist Details for Transfer Assistance: cues for hand placement and RLE positioning for comfort; increased time for sit to stand Ambulation/Gait Ambulation/Gait Assistance: 4: Min guard Ambulation Distance (Feet): 65 Feet Assistive device: Rolling walker Ambulation/Gait Assistance Details: cues for Rw distance form self Gait Pattern: Step-to pattern Gait velocity: decreased General Gait Details: pt continues with c/o feeling of heart "racing"; HR  at rest and incr to 119 after amb x 10; HR up to 122 max during activity, returns to low 80s at rest; RN  present/aware    Exercises General Exercises - Lower Extremity Ankle Circles/Pumps: AROM;Both;10 reps Quad Sets: AROM;Both;10 reps Heel Slides: AAROM;Right;10 reps   PT Diagnosis:    PT Problem List:   PT Treatment Interventions:     PT Goals Acute Rehab PT Goals Time For Goal Achievement: 04/13/13 Potential to Achieve Goals: Good Pt will go Supine/Side to Sit: with supervision PT Goal: Supine/Side to Sit - Progress: Progressing toward goal Pt will go Sit to Stand: with modified independence PT Goal: Sit to Stand - Progress: Progressing toward goal Pt will Ambulate: 51 - 150 feet;with supervision;with min assist PT Goal: Ambulate - Progress: Progressing toward goal  Visit Information  Last PT Received On: 04/08/13 Assistance Needed: +1    Subjective Data  Subjective: I didn't sleep Patient Stated Goal: home   Cognition  Cognition Arousal/Alertness: Awake/alert Behavior During Therapy: WFL for tasks assessed/performed Overall Cognitive Status: Within Functional Limits for tasks assessed    Balance     End of Session PT - End of Session Activity Tolerance: Patient limited by fatigue;Treatment limited secondary to medical complications (Comment) Patient left: in chair;with call bell/phone within reach   GP     [facility] 04/08/2013, 9:45 AM

## 2013-04-09 ENCOUNTER — Encounter (HOSPITAL_COMMUNITY): Payer: Self-pay | Admitting: Orthopedic Surgery

## 2013-04-09 NOTE — Progress Notes (Signed)
Discharge summary sent to payer through [facility]  
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
## 2014-03-26 ENCOUNTER — Encounter (HOSPITAL_COMMUNITY): Payer: Self-pay | Admitting: Pharmacy Technician

## 2014-03-31 ENCOUNTER — Other Ambulatory Visit (HOSPITAL_COMMUNITY): Payer: Self-pay | Admitting: Anesthesiology

## 2014-03-31 ENCOUNTER — Other Ambulatory Visit (HOSPITAL_COMMUNITY): Payer: Self-pay | Admitting: Orthopedic Surgery

## 2014-04-01 ENCOUNTER — Encounter (HOSPITAL_COMMUNITY): Payer: Self-pay

## 2014-04-01 ENCOUNTER — Encounter (HOSPITAL_COMMUNITY)
Admission: RE | Admit: 2014-04-01 | Discharge: 2014-04-01 | Disposition: A | Discharge status: Home / Self Care | Payer: BC Managed Care – PPO | Source: Ambulatory Visit | Attending: Orthopedic Surgery | Admitting: Orthopedic Surgery

## 2014-04-01 DIAGNOSIS — Z01812 Encounter for preprocedural laboratory examination: Secondary | ICD-10-CM | POA: Insufficient documentation

## 2014-04-01 HISTORY — DX: Other complications of anesthesia, initial encounter: T88.59XA

## 2014-04-01 HISTORY — DX: Nausea with vomiting, unspecified: Z98.890

## 2014-04-01 HISTORY — DX: Nausea with vomiting, unspecified: R11.2

## 2014-04-01 HISTORY — DX: Adverse effect of unspecified anesthetic, initial encounter: T41.45XA

## 2014-04-01 LAB — URINALYSIS, ROUTINE W REFLEX MICROSCOPIC
Bilirubin Urine: NEGATIVE
GLUCOSE, UA: NEGATIVE mg/dL
HGB URINE DIPSTICK: NEGATIVE
Ketones, ur: NEGATIVE mg/dL
Nitrite: NEGATIVE
PH: 6 (ref 5.0–8.0)
Protein, ur: NEGATIVE mg/dL
SPECIFIC GRAVITY, URINE: 1.025 (ref 1.005–1.030)
Urobilinogen, UA: 0.2 mg/dL (ref 0.0–1.0)

## 2014-04-01 LAB — PROTIME-INR
INR: 1 (ref 0.00–1.49)
Prothrombin Time: 13 seconds (ref 11.6–15.2)

## 2014-04-01 LAB — BASIC METABOLIC PANEL
BUN: 9 mg/dL (ref 6–23)
CALCIUM: 9.2 mg/dL (ref 8.4–10.5)
CHLORIDE: 100 meq/L (ref 96–112)
CO2: 25 meq/L (ref 19–32)
Creatinine, Ser: 0.55 mg/dL (ref 0.50–1.10)
GFR calc Af Amer: >90 mL/min (ref >90)
GFR calc non Af Amer: >90 mL/min (ref >90)
GLUCOSE: 95 mg/dL (ref 70–99)
Potassium: 3.8 mEq/L (ref 3.7–5.3)
SODIUM: 137 meq/L (ref 137–147)

## 2014-04-01 LAB — SURGICAL PCR SCREEN
MRSA, PCR: NEGATIVE
Staphylococcus aureus: NEGATIVE

## 2014-04-01 LAB — URINE MICROSCOPIC-ADD ON

## 2014-04-01 LAB — CBC
HEMATOCRIT: 36.8 % (ref 36.0–46.0)
HEMOGLOBIN: 12 g/dL (ref 12.0–15.0)
MCH: 27.6 pg (ref 26.0–34.0)
MCHC: 32.6 g/dL (ref 30.0–36.0)
MCV: 84.6 fL (ref 78.0–100.0)
PLATELETS: 309 10*3/uL (ref 150–400)
RBC: 4.35 MIL/uL (ref 3.87–5.11)
RDW: 13.3 % (ref 11.5–15.5)
WBC: 4.9 10*3/uL (ref 4.0–10.5)

## 2014-04-01 LAB — APTT: aPTT: 29 seconds (ref 24–37)

## 2014-04-01 LAB — HCG, SERUM, QUALITATIVE: Preg, Serum: NEGATIVE

## 2014-04-01 NOTE — Patient Instructions (Signed)
Your procedure is scheduled on:  04/08/14  TUESDAY  [facility]      AM.  Call this number if you have problems the morning of surgery: [phone]        Do not eat food  Or drink :After Midnight. Monday NIGHT   Take these medicines the morning of surgery with A SIP OF WATER: NONE   .  Contacts, dentures or partial plates, or metal hairpins  can not be worn to surgery. Your family will be responsible for glasses, dentures, hearing aides while you are in surgery  Leave suitcase in the car. After surgery it may be brought to your room.  For patients admitted to the hospital, checkout time is 11:00 AM day of  discharge.                DO NOT WEAR JEWELRY, LOTIONS, POWDERS, OR PERFUMES.  WOMEN-- DO NOT SHAVE LEGS OR UNDERARMS FOR 48 HOURS BEFORE SHOWERS. MEN MAY SHAVE FACE.  Patients discharged the day of surgery will not be allowed to drive home. IF going home the day of surgery, you must have a driver and someone to stay with you for the first 24 hours                                                                                                                                   [place] - Preparing for Surgery Before surgery, you can play an important role.  Because skin is not sterile, your skin needs to be as free of germs as possible.  You can reduce the number of germs on your skin by washing with CHG (chlorahexidine gluconate) soap before surgery.  CHG is an antiseptic cleaner which kills germs and bonds with the skin to continue killing germs even after washing. Please DO NOT use if you have an allergy to CHG or antibacterial soaps.  If your skin becomes reddened/irritated stop using the CHG and inform your nurse when you arrive at Short Stay. Do not shave (including legs and underarms) for at least 48 hours prior to the first CHG shower.  You may shave your face/neck. Please follow these instructions carefully:  1.  Shower with CHG Soap  the night before surgery and the  morning of Surgery.  2.  If you choose to wash your hair, wash your hair first as usual with your  normal  shampoo.  3.  After you shampoo, rinse your hair and body thoroughly to remove the  shampoo.                           4.  Use CHG as you would any other liquid soap.  You can apply chg directly  to the skin and wash  Gently with a scrungie or clean washcloth.  5.  Apply the CHG Soap to your body ONLY FROM THE NECK DOWN.   Do not use on face/ open                           Wound or open sores. Avoid contact with eyes, ears mouth and genitals (private parts).                       Wash face,  Genitals (private parts) with your normal soap.             6.  Wash thoroughly, paying special attention to the area where your surgery  will be performed.  7.  Thoroughly rinse your body with warm water from the neck down.  8.  DO NOT shower/wash with your normal soap after using and rinsing off  the CHG Soap.                9.  Pat yourself dry with a clean towel.            10.  Wear clean pajamas.            11.  Place clean sheets on your bed the night of your first shower and do not  sleep with pets. Day of Surgery : Do not apply any lotions the morning of surgery.  Please wear clean clothes to the hospital/surgery center.  FAILURE TO FOLLOW THESE INSTRUCTIONS MAY RESULT IN THE CANCELLATION OF YOUR SURGERY PATIENT SIGNATURE_________________________________  NURSE SIGNATURE__________________________________  ________________________________________________________________________  WHAT IS A BLOOD TRANSFUSION? Blood Transfusion Information  A transfusion is the replacement of blood or some of its parts. Blood is made up of multiple cells which provide different functions.  Red blood cells carry oxygen and are used for blood loss replacement.  White blood cells fight against infection.  Platelets control bleeding.  Plasma helps clot  blood.  Other blood products are available for specialized needs, such as hemophilia or other clotting disorders. BEFORE THE TRANSFUSION  Who gives blood for transfusions?   Healthy volunteers who are fully evaluated to make sure their blood is safe. This is blood bank blood. Transfusion therapy is the safest it has ever been in the practice of medicine. Before blood is taken from a donor, a complete history is taken to make sure that person has no history of diseases nor engages in risky social behavior (examples are intravenous drug use or sexual activity with multiple partners). The donor's travel history is screened to minimize risk of transmitting infections, such as malaria. The donated blood is tested for signs of infectious diseases, such as HIV and hepatitis. The blood is then tested to be sure it is compatible with you in order to minimize the chance of a transfusion reaction. If you or a relative donates blood, this is often done in anticipation of surgery and is not appropriate for emergency situations. It takes many days to process the donated blood. RISKS AND COMPLICATIONS Although transfusion therapy is very safe and saves many lives, the main dangers of transfusion include:   Getting an infectious disease.  Developing a transfusion reaction. This is an allergic reaction to something in the blood you were given. Every precaution is taken to prevent this. The decision to have a blood transfusion has been considered carefully by your caregiver before blood is given. Blood is not given unless the benefits outweigh  the risks. AFTER THE TRANSFUSION  Right after receiving a blood transfusion, you will usually feel much better and more energetic. This is especially true if your red blood cells have gotten low (anemic). The transfusion raises the level of the red blood cells which carry oxygen, and this usually causes an energy increase.  The nurse administering the transfusion will monitor  you carefully for complications. HOME CARE INSTRUCTIONS  No special instructions are needed after a transfusion. You may find your energy is better. Speak with your caregiver about any limitations on activity for underlying diseases you may have. SEEK MEDICAL CARE IF:   Your condition is not improving after your transfusion.  You develop redness or irritation at the intravenous (IV) site. SEEK IMMEDIATE MEDICAL CARE IF:  Any of the following symptoms occur over the next 12 hours:  Shaking chills.  You have a temperature by mouth above 102 F (38.9 C), not controlled by medicine.  Chest, back, or muscle pain.  People around you feel you are not acting correctly or are confused.  Shortness of breath or difficulty breathing.  Dizziness and fainting.  You get a rash or develop hives.  You have a decrease in urine output.  Your urine turns a dark color or changes to pink, red, or brown. Any of the following symptoms occur over the next 10 days:  You have a temperature by mouth above 102 F (38.9 C), not controlled by medicine.  Shortness of breath.  Weakness after normal activity.  The white part of the eye turns yellow (jaundice).  You have a decrease in the amount of urine or are urinating less often.  Your urine turns a dark color or changes to pink, red, or brown. Document Released: 10/28/2000 Document Revised: 01/23/2012 Document Reviewed: 06/16/2008 ExitCare Patient Information 2014 [place], [place].  _______________________________________________________________________  Incentive Spirometer  An incentive spirometer is a tool that can help keep your lungs clear and active. This tool measures how well you are filling your lungs with each breath. Taking long deep breaths may help reverse or decrease the chance of developing breathing (pulmonary) problems (especially infection) following:  A long period of time when you are unable to move or be active. BEFORE THE  PROCEDURE   If the spirometer includes an indicator to show your best effort, your nurse or respiratory therapist will set it to a desired goal.  If possible, sit up straight or lean slightly forward. Try not to slouch.  Hold the incentive spirometer in an upright position. INSTRUCTIONS FOR USE  1. Sit on the edge of your bed if possible, or sit up as far as you can in bed or on a chair. 2. Hold the incentive spirometer in an upright position. 3. Breathe out normally. 4. Place the mouthpiece in your mouth and seal your lips tightly around it. 5. Breathe in slowly and as deeply as possible, raising the piston or the ball toward the top of the column. 6. Hold your breath for 3-5 seconds or for as long as possible. Allow the piston or ball to fall to the bottom of the column. 7. Remove the mouthpiece from your mouth and breathe out normally. 8. Rest for a few seconds and repeat Steps 1 through 7 at least 10 times every 1-2 hours when you are awake. Take your time and take a few normal breaths between deep breaths. 9. The spirometer may include an indicator to show your best effort. Use the indicator as a goal to work  toward during each repetition. 10. After each set of 10 deep breaths, practice coughing to be sure your lungs are clear. If you have an incision (the cut made at the time of surgery), support your incision when coughing by placing a pillow or rolled up towels firmly against it. Once you are able to get out of bed, walk around indoors and cough well. You may stop using the incentive spirometer when instructed by your caregiver.  RISKS AND COMPLICATIONS  Take your time so you do not get dizzy or light-headed.  If you are in pain, you may need to take or ask for pain medication before doing incentive spirometry. It is harder to take a deep breath if you are having pain. AFTER USE  Rest and breathe slowly and easily.  It can be helpful to keep track of a log of your progress. Your  caregiver can provide you with a simple table to help with this. If you are using the spirometer at home, follow these instructions: SEEK MEDICAL CARE IF:   You are having difficultly using the spirometer.  You have trouble using the spirometer as often as instructed.  Your pain medication is not giving enough relief while using the spirometer.  You develop fever of 100.5 F (38.1 C) or higher. SEEK IMMEDIATE MEDICAL CARE IF:   You cough up bloody sputum that had not been present before.  You develop fever of 102 F (38.9 C) or greater.  You develop worsening pain at or near the incision site. MAKE SURE YOU:   Understand these instructions.  Will watch your condition.  Will get help right away if you are not doing well or get worse. Document Released: 03/13/2007 Document Revised: 01/23/2012 Document Reviewed: 05/14/2007 [facility] Patient Information 2014 [employer] [place].   ________________________________________________________________________

## 2014-04-01 NOTE — Progress Notes (Signed)
Faxed u/a with micro to dr olin via epic 

## 2014-04-04 NOTE — H&P (Signed)
TOTAL HIP ADMISSION H&P  Patient is admitted for right total hip arthroplasty ceramic-on-ceramic, anterior approach.  Subjective:  Chief Complaint:    Right hip AVN / pain  HPI: Karen Macias, 33 y.o. female, has a history of pain and functional disability in the right hip(s) due to AVN and patient has failed non-surgical conservative treatments for greater than 12 weeks to include NSAID's and/or analgesics, corticosteriod injections and activity modification.  Onset of symptoms was gradual starting years ago with gradually worsening course since that time.The patient noted prior procedures of the hip to include core decompression in May 2014.  At first this did well, but the pain gradually returned and worsened on the right hip(s).  Patient currently rates pain in the right hip at 7 out of 10 with activity. Patient has night pain, worsening of pain with activity and weight bearing, trendelenberg gait, pain that interfers with activities of daily living and pain with passive range of motion. Patient has evidence of periarticular osteophytes and joint space narrowing by imaging studies. This condition presents safety issues increasing the risk of falls.  There is no current active infection.  Risks, benefits and expectations were discussed with the patient.  Risks including but not limited to the risk of anesthesia, blood clots, nerve damage, blood vessel damage, failure of the prosthesis, infection and up to and including death.  Patient understand the risks, benefits and expectations and wishes to proceed with surgery.   PCP:   Simone Curia, MD   D/C Plans:      Home with HHPT  Post-op Meds:       No Rx given  Tranexamic Acid:      is to be given  Decadron:      is to be given  FYI:    ASA post-op   Norco post-op   Past Medical History  Diagnosis Date  . Headache(784.0)     HISTORY OF MIGRAINES  . Avascular necrosis     RT HIP  . Insomnia   . Complication of anesthesia   . PONV  (postoperative nausea and vomiting)     Past Surgical History  Procedure Laterality Date  . Tubal ligation    . Appendectomy    . Decompression hip-core Right 04/05/2013    Procedure: RIGHT HIP CORE - DECOMPRESSION;  Surgeon: Shelda Pal, MD;  Location: [facility];  Service: Orthopedics;  Laterality: Right;  . Cholecystectomy      No prescriptions prior to admission   Allergies  Allergen Reactions  . Darvocet [Propoxyphene N-Acetaminophen] Hives    History  Substance Use Topics  . Smoking status: Never Smoker   . Smokeless tobacco: Never Used  . Alcohol Use: No    No family history on file.   Review of Systems  Constitutional: Negative.   Eyes: Negative.   Respiratory: Negative.   Cardiovascular: Negative.   Gastrointestinal: Negative.   Genitourinary: Negative.   Musculoskeletal: Positive for joint pain.  Skin: Negative.   Neurological: Positive for headaches.  Endo/Heme/Allergies: Negative.   Psychiatric/Behavioral: The patient has insomnia.     Objective:  Physical Exam  Constitutional: She is oriented to person, place, and time. She appears well-developed and well-nourished.  HENT:  Head: Normocephalic and atraumatic.  Mouth/Throat: Oropharynx is clear and moist.  Eyes: Pupils are equal, round, and reactive to light.  Neck: Neck supple. No JVD present. No tracheal deviation present. No thyromegaly present.  Cardiovascular: Normal rate, regular rhythm, normal heart sounds and intact distal pulses.  Respiratory: Effort normal and breath sounds normal. No stridor. No respiratory distress. She has no wheezes.  GI: Soft. There is no tenderness. There is no guarding.  Musculoskeletal:       Right hip: She exhibits decreased range of motion, decreased strength, tenderness and bony tenderness. She exhibits no swelling, no deformity and no laceration.  Lymphadenopathy:    She has no cervical adenopathy.  Neurological: She is alert and oriented to person, place, and  time.  Skin: Skin is warm and dry.  Psychiatric: She has a normal mood and affect.     Labs:  Estimated body mass index is 39.13 kg/(m^2) as calculated from the following:   Height as of 04/05/13: 5\' 5"  (1.651 m).   Weight as of 03/28/13: 106.652 kg (235 lb 2 oz).   Imaging Review Plain radiographs demonstrate severe degenerative joint disease of the right hip(s). The bone quality appears to be good for age and reported activity level.  Assessment/Plan:  End stage arthritis, right hip(s)  The patient history, physical examination, clinical judgement of the provider and imaging studies are consistent with end stage degenerative joint disease of the right hip(s) and total hip arthroplasty is deemed medically necessary. The treatment options including medical management, injection therapy, arthroscopy and arthroplasty were discussed at length. The risks and benefits of total hip arthroplasty were presented and reviewed. The risks due to aseptic loosening, infection, stiffness, dislocation/subluxation,  thromboembolic complications and other imponderables were discussed.  The patient acknowledged the explanation, agreed to proceed with the plan and consent was signed. Patient is being admitted for inpatient treatment for surgery, pain control, PT, OT, prophylactic antibiotics, VTE prophylaxis, progressive ambulation and ADL's and discharge planning.The patient is planning to be discharged home with home health services.       Anastasio AuerbachMatthew S. Ripley Bogosian   PA-C  04/04/2014, 1:27 PM

## 2014-04-08 ENCOUNTER — Inpatient Hospital Stay (HOSPITAL_COMMUNITY): Payer: BC Managed Care – PPO | Admitting: Anesthesiology

## 2014-04-08 ENCOUNTER — Encounter (HOSPITAL_COMMUNITY): Payer: BC Managed Care – PPO | Admitting: Anesthesiology

## 2014-04-08 ENCOUNTER — Inpatient Hospital Stay (HOSPITAL_COMMUNITY): Payer: BC Managed Care – PPO

## 2014-04-08 ENCOUNTER — Encounter (HOSPITAL_COMMUNITY)
Admission: RE | Disposition: A | Discharge status: Home Health | Payer: Self-pay | Source: Ambulatory Visit | Attending: Orthopedic Surgery

## 2014-04-08 ENCOUNTER — Encounter (HOSPITAL_COMMUNITY): Payer: Self-pay | Admitting: *Deleted

## 2014-04-08 ENCOUNTER — Inpatient Hospital Stay (HOSPITAL_COMMUNITY)
Admission: RE | Admit: 2014-04-08 | Discharge: 2014-04-10 | DRG: 470 | Disposition: A | Discharge status: Home Health | Payer: BC Managed Care – PPO | Source: Ambulatory Visit | Attending: Orthopedic Surgery | Admitting: Orthopedic Surgery

## 2014-04-08 DIAGNOSIS — Z6839 Body mass index (BMI) 39.0-39.9, adult: Secondary | ICD-10-CM

## 2014-04-08 DIAGNOSIS — E669 Obesity, unspecified: Secondary | ICD-10-CM | POA: Diagnosis present

## 2014-04-08 DIAGNOSIS — D62 Acute posthemorrhagic anemia: Secondary | ICD-10-CM | POA: Diagnosis not present

## 2014-04-08 DIAGNOSIS — Z96649 Presence of unspecified artificial hip joint: Secondary | ICD-10-CM

## 2014-04-08 DIAGNOSIS — M87059 Idiopathic aseptic necrosis of unspecified femur: Principal | ICD-10-CM | POA: Diagnosis present

## 2014-04-08 DIAGNOSIS — Z01812 Encounter for preprocedural laboratory examination: Secondary | ICD-10-CM

## 2014-04-08 DIAGNOSIS — D500 Iron deficiency anemia secondary to blood loss (chronic): Secondary | ICD-10-CM | POA: Diagnosis not present

## 2014-04-08 HISTORY — PX: TOTAL HIP ARTHROPLASTY: SHX124

## 2014-04-08 LAB — TYPE AND SCREEN
ABO/RH(D): A POS
ANTIBODY SCREEN: NEGATIVE

## 2014-04-08 SURGERY — ARTHROPLASTY, HIP, TOTAL, ANTERIOR APPROACH
Anesthesia: General | Site: Hip | Laterality: Right

## 2014-04-08 MED ORDER — BISACODYL 10 MG RE SUPP: 10.0000 mg | Freq: Every day | RECTAL | Status: DC | PRN

## 2014-04-08 MED ORDER — MIDAZOLAM HCL 2 MG/2ML IJ SOLN
INTRAMUSCULAR | Status: AC
  Filled 2014-04-08: qty 2

## 2014-04-08 MED ORDER — HYDROMORPHONE HCL PF 1 MG/ML IJ SOLN
INTRAMUSCULAR | Status: AC
  Filled 2014-04-08: qty 1

## 2014-04-08 MED ORDER — ZOLPIDEM TARTRATE 10 MG PO TABS: 10.0000 mg | ORAL_TABLET | Freq: Every evening | ORAL | Status: DC | PRN

## 2014-04-08 MED ORDER — METOCLOPRAMIDE HCL 10 MG PO TABS: 5.0000 mg | ORAL_TABLET | Freq: Three times a day (TID) | ORAL | Status: DC | PRN

## 2014-04-08 MED ORDER — OXYCODONE HCL 5 MG PO TABS: 5.0000 mg | ORAL_TABLET | Freq: Once | ORAL | Status: DC | PRN

## 2014-04-08 MED ORDER — DIPHENHYDRAMINE HCL 25 MG PO CAPS: 25.0000 mg | ORAL_CAPSULE | Freq: Four times a day (QID) | ORAL | Status: DC | PRN

## 2014-04-08 MED ORDER — SODIUM CHLORIDE 0.9 % IV SOLN
100.0000 mL/h | INTRAVENOUS | Status: DC
  Administered 2014-04-08 – 2014-04-09 (×2): 100 mL/h via INTRAVENOUS
  Filled 2014-04-08 (×7): qty 1000

## 2014-04-08 MED ORDER — NEOSTIGMINE METHYLSULFATE 10 MG/10ML IV SOLN
INTRAVENOUS | Status: DC | PRN
  Administered 2014-04-08: 4 mg via INTRAVENOUS

## 2014-04-08 MED ORDER — FENTANYL CITRATE 0.05 MG/ML IJ SOLN
INTRAMUSCULAR | Status: DC | PRN
  Administered 2014-04-08: 100 ug via INTRAVENOUS
  Administered 2014-04-08 (×2): 50 ug via INTRAVENOUS

## 2014-04-08 MED ORDER — DOCUSATE SODIUM 100 MG PO CAPS
100.0000 mg | ORAL_CAPSULE | Freq: Two times a day (BID) | ORAL | Status: DC
  Administered 2014-04-08 – 2014-04-10 (×4): 100 mg via ORAL

## 2014-04-08 MED ORDER — PROPOFOL 10 MG/ML IV BOLUS
INTRAVENOUS | Status: DC | PRN
  Administered 2014-04-08: 170 mg via INTRAVENOUS

## 2014-04-08 MED ORDER — METOCLOPRAMIDE HCL 5 MG/ML IJ SOLN
INTRAMUSCULAR | Status: AC
  Filled 2014-04-08: qty 2

## 2014-04-08 MED ORDER — MEPERIDINE HCL 50 MG/ML IJ SOLN: 6.2500 mg | INTRAMUSCULAR | Status: DC | PRN

## 2014-04-08 MED ORDER — HYDROMORPHONE HCL PF 1 MG/ML IJ SOLN
0.5000 mg | INTRAMUSCULAR | Status: DC | PRN
  Administered 2014-04-08 (×3): 2 mg via INTRAVENOUS
  Administered 2014-04-08: 1 mg via INTRAVENOUS
  Administered 2014-04-09: 0.5 mg via INTRAVENOUS
  Filled 2014-04-08 (×2): qty 2
  Filled 2014-04-08 (×2): qty 1
  Filled 2014-04-08: qty 2

## 2014-04-08 MED ORDER — DEXAMETHASONE SODIUM PHOSPHATE 10 MG/ML IJ SOLN
INTRAMUSCULAR | Status: AC
  Filled 2014-04-08: qty 1

## 2014-04-08 MED ORDER — CISATRACURIUM BESYLATE (PF) 10 MG/5ML IV SOLN
INTRAVENOUS | Status: DC | PRN
  Administered 2014-04-08: 2 mg via INTRAVENOUS
  Administered 2014-04-08: 6 mg via INTRAVENOUS

## 2014-04-08 MED ORDER — HYDROMORPHONE HCL PF 1 MG/ML IJ SOLN
0.2500 mg | INTRAMUSCULAR | Status: DC | PRN
  Administered 2014-04-08 (×4): 0.5 mg via INTRAVENOUS

## 2014-04-08 MED ORDER — CEFAZOLIN SODIUM-DEXTROSE 2-3 GM-% IV SOLR
INTRAVENOUS | Status: AC
  Filled 2014-04-08: qty 50

## 2014-04-08 MED ORDER — DEXAMETHASONE SODIUM PHOSPHATE 10 MG/ML IJ SOLN
INTRAMUSCULAR | Status: DC | PRN
  Administered 2014-04-08: 10 mg via INTRAVENOUS

## 2014-04-08 MED ORDER — ZOLPIDEM TARTRATE 5 MG PO TABS: 5.0000 mg | ORAL_TABLET | Freq: Every evening | ORAL | Status: DC | PRN

## 2014-04-08 MED ORDER — CISATRACURIUM BESYLATE 20 MG/10ML IV SOLN
INTRAVENOUS | Status: AC
  Filled 2014-04-08: qty 10

## 2014-04-08 MED ORDER — CEFAZOLIN SODIUM-DEXTROSE 2-3 GM-% IV SOLR
2.0000 g | INTRAVENOUS | Status: AC
  Administered 2014-04-08: 2 g via INTRAVENOUS

## 2014-04-08 MED ORDER — CEFAZOLIN SODIUM-DEXTROSE 2-3 GM-% IV SOLR
2.0000 g | Freq: Four times a day (QID) | INTRAVENOUS | Status: AC
  Administered 2014-04-08 (×2): 2 g via INTRAVENOUS
  Filled 2014-04-08 (×2): qty 50

## 2014-04-08 MED ORDER — NEOSTIGMINE METHYLSULFATE 10 MG/10ML IV SOLN
INTRAVENOUS | Status: AC
  Filled 2014-04-08: qty 1

## 2014-04-08 MED ORDER — CELECOXIB 200 MG PO CAPS
200.0000 mg | ORAL_CAPSULE | Freq: Two times a day (BID) | ORAL | Status: DC
  Administered 2014-04-08 – 2014-04-10 (×5): 200 mg via ORAL
  Filled 2014-04-08 (×6): qty 1

## 2014-04-08 MED ORDER — MAGNESIUM CITRATE PO SOLN: 1.0000 | Freq: Once | ORAL | Status: AC | PRN

## 2014-04-08 MED ORDER — LACTATED RINGERS IV SOLN
INTRAVENOUS | Status: DC | PRN
  Administered 2014-04-08 (×2): via INTRAVENOUS

## 2014-04-08 MED ORDER — ONDANSETRON HCL 4 MG/2ML IJ SOLN
4.0000 mg | Freq: Four times a day (QID) | INTRAMUSCULAR | Status: DC | PRN
  Administered 2014-04-08: 4 mg via INTRAVENOUS
  Filled 2014-04-08: qty 2

## 2014-04-08 MED ORDER — METOCLOPRAMIDE HCL 5 MG/ML IJ SOLN: 5.0000 mg | Freq: Three times a day (TID) | INTRAMUSCULAR | Status: DC | PRN

## 2014-04-08 MED ORDER — DEXAMETHASONE SODIUM PHOSPHATE 10 MG/ML IJ SOLN
10.0000 mg | Freq: Once | INTRAMUSCULAR | Status: AC
  Administered 2014-04-09: 10 mg via INTRAVENOUS
  Filled 2014-04-08: qty 1

## 2014-04-08 MED ORDER — METHOCARBAMOL 500 MG PO TABS
500.0000 mg | ORAL_TABLET | Freq: Four times a day (QID) | ORAL | Status: DC | PRN
  Administered 2014-04-09 – 2014-04-10 (×5): 500 mg via ORAL
  Filled 2014-04-08 (×5): qty 1

## 2014-04-08 MED ORDER — LACTATED RINGERS IV SOLN: INTRAVENOUS | Status: DC

## 2014-04-08 MED ORDER — METOCLOPRAMIDE HCL 5 MG/ML IJ SOLN
INTRAMUSCULAR | Status: DC | PRN
  Administered 2014-04-08: 10 mg via INTRAVENOUS

## 2014-04-08 MED ORDER — MIDAZOLAM HCL 5 MG/5ML IJ SOLN
INTRAMUSCULAR | Status: DC | PRN
Start: 2014-04-08 — End: 2014-04-08
  Administered 2014-04-08: 2 mg via INTRAVENOUS

## 2014-04-08 MED ORDER — PROMETHAZINE HCL 25 MG/ML IJ SOLN
12.5000 mg | Freq: Four times a day (QID) | INTRAMUSCULAR | Status: DC | PRN
  Administered 2014-04-08 – 2014-04-09 (×2): 12.5 mg via INTRAVENOUS
  Filled 2014-04-08: qty 1

## 2014-04-08 MED ORDER — PHENYLEPHRINE HCL 10 MG/ML IJ SOLN
INTRAMUSCULAR | Status: DC | PRN
  Administered 2014-04-08: 80 ug via INTRAVENOUS

## 2014-04-08 MED ORDER — PHENYLEPHRINE 40 MCG/ML (10ML) SYRINGE FOR IV PUSH (FOR BLOOD PRESSURE SUPPORT)
PREFILLED_SYRINGE | INTRAVENOUS | Status: AC
  Filled 2014-04-08: qty 10

## 2014-04-08 MED ORDER — ONDANSETRON HCL 4 MG/2ML IJ SOLN
INTRAMUSCULAR | Status: AC
Start: 2014-04-08 — End: 2014-04-08
  Filled 2014-04-08: qty 2

## 2014-04-08 MED ORDER — METHOCARBAMOL 1000 MG/10ML IJ SOLN
500.0000 mg | Freq: Four times a day (QID) | INTRAMUSCULAR | Status: DC | PRN
  Administered 2014-04-08 (×2): 500 mg via INTRAVENOUS
  Filled 2014-04-08 (×2): qty 5

## 2014-04-08 MED ORDER — ONDANSETRON HCL 4 MG/2ML IJ SOLN
INTRAMUSCULAR | Status: DC | PRN
  Administered 2014-04-08: 4 mg via INTRAVENOUS

## 2014-04-08 MED ORDER — SUCCINYLCHOLINE CHLORIDE 20 MG/ML IJ SOLN
INTRAMUSCULAR | Status: DC | PRN
  Administered 2014-04-08: 100 mg via INTRAVENOUS

## 2014-04-08 MED ORDER — PROPOFOL 10 MG/ML IV BOLUS
INTRAVENOUS | Status: AC
Start: 2014-04-08 — End: 2014-04-08
  Filled 2014-04-08: qty 20

## 2014-04-08 MED ORDER — PROMETHAZINE HCL 25 MG/ML IJ SOLN: 6.2500 mg | INTRAMUSCULAR | Status: DC | PRN

## 2014-04-08 MED ORDER — TRANEXAMIC ACID 100 MG/ML IV SOLN
1000.0000 mg | Freq: Once | INTRAVENOUS | Status: AC
  Administered 2014-04-08: 1000 mg via INTRAVENOUS
  Filled 2014-04-08: qty 10

## 2014-04-08 MED ORDER — FERROUS SULFATE 325 (65 FE) MG PO TABS
325.0000 mg | ORAL_TABLET | Freq: Three times a day (TID) | ORAL | Status: DC
Start: 2014-04-08 — End: 2014-04-10
  Administered 2014-04-09 – 2014-04-10 (×5): 325 mg via ORAL
  Filled 2014-04-08 (×9): qty 1

## 2014-04-08 MED ORDER — OXYCODONE HCL 5 MG/5ML PO SOLN
5.0000 mg | Freq: Once | ORAL | Status: DC | PRN
  Filled 2014-04-08: qty 5

## 2014-04-08 MED ORDER — FENTANYL CITRATE 0.05 MG/ML IJ SOLN
INTRAMUSCULAR | Status: AC
  Filled 2014-04-08: qty 5

## 2014-04-08 MED ORDER — DEXAMETHASONE SODIUM PHOSPHATE 10 MG/ML IJ SOLN: 10.0000 mg | Freq: Once | INTRAMUSCULAR | Status: DC

## 2014-04-08 MED ORDER — OXYCODONE HCL 5 MG PO TABS
5.0000 mg | ORAL_TABLET | ORAL | Status: DC | PRN
  Administered 2014-04-08: 15 mg via ORAL
  Administered 2014-04-08: 10 mg via ORAL
  Administered 2014-04-09 – 2014-04-10 (×9): 15 mg via ORAL
  Filled 2014-04-08 (×5): qty 3
  Filled 2014-04-08: qty 2
  Filled 2014-04-08 (×5): qty 3

## 2014-04-08 MED ORDER — GLYCOPYRROLATE 0.2 MG/ML IJ SOLN
INTRAMUSCULAR | Status: AC
  Filled 2014-04-08: qty 3

## 2014-04-08 MED ORDER — ALUM & MAG HYDROXIDE-SIMETH 200-200-20 MG/5ML PO SUSP: 30.0000 mL | ORAL | Status: DC | PRN

## 2014-04-08 MED ORDER — MENTHOL 3 MG MT LOZG
1.0000 | LOZENGE | OROMUCOSAL | Status: DC | PRN
  Filled 2014-04-08: qty 9

## 2014-04-08 MED ORDER — POLYETHYLENE GLYCOL 3350 17 G PO PACK
17.0000 g | PACK | Freq: Two times a day (BID) | ORAL | Status: DC
  Administered 2014-04-09 (×2): 17 g via ORAL

## 2014-04-08 MED ORDER — PHENOL 1.4 % MT LIQD
1.0000 | OROMUCOSAL | Status: DC | PRN
  Filled 2014-04-08: qty 177

## 2014-04-08 MED ORDER — ASPIRIN EC 325 MG PO TBEC
325.0000 mg | DELAYED_RELEASE_TABLET | Freq: Two times a day (BID) | ORAL | Status: DC
  Administered 2014-04-09 – 2014-04-10 (×3): 325 mg via ORAL
  Filled 2014-04-08 (×5): qty 1

## 2014-04-08 MED ORDER — HYDROCODONE-ACETAMINOPHEN 7.5-325 MG PO TABS
1.0000 | ORAL_TABLET | ORAL | Status: DC
  Administered 2014-04-08: 1 via ORAL
  Filled 2014-04-08: qty 1

## 2014-04-08 MED ORDER — ONDANSETRON HCL 4 MG PO TABS: 4.0000 mg | ORAL_TABLET | Freq: Four times a day (QID) | ORAL | Status: DC | PRN

## 2014-04-08 MED ORDER — 0.9 % SODIUM CHLORIDE (POUR BTL) OPTIME
TOPICAL | Status: DC | PRN
  Administered 2014-04-08: 1000 mL

## 2014-04-08 MED ORDER — GLYCOPYRROLATE 0.2 MG/ML IJ SOLN
INTRAMUSCULAR | Status: DC | PRN
  Administered 2014-04-08: 0.6 mg via INTRAVENOUS

## 2014-04-08 SURGICAL SUPPLY — 35 items
ADH SKN CLS APL DERMABOND .7 (GAUZE/BANDAGES/DRESSINGS) ×1
BAG ZIPLOCK 12X15 (MISCELLANEOUS) IMPLANT
CAPT HIP PF COP ×3 IMPLANT
COVER PERINEAL POST (MISCELLANEOUS) ×3 IMPLANT
DERMABOND ADVANCED (GAUZE/BANDAGES/DRESSINGS) ×2
DERMABOND ADVANCED .7 DNX12 (GAUZE/BANDAGES/DRESSINGS) ×1 IMPLANT
DRAPE C-ARM 42X120 X-RAY (DRAPES) ×3 IMPLANT
DRAPE STERI IOBAN 125X83 (DRAPES) ×3 IMPLANT
DRAPE U-SHAPE 47X51 STRL (DRAPES) ×9 IMPLANT
DRSG AQUACEL AG ADV 3.5X10 (GAUZE/BANDAGES/DRESSINGS) ×3 IMPLANT
DURAPREP 26ML APPLICATOR (WOUND CARE) ×3 IMPLANT
ELECT BLADE TIP CTD 4 INCH (ELECTRODE) ×3 IMPLANT
ELECT PENCIL ROCKER SW 15FT (MISCELLANEOUS) ×3 IMPLANT
ELECT REM PT RETURN 15FT ADLT (MISCELLANEOUS) ×3 IMPLANT
FACESHIELD WRAPAROUND (MASK) ×9 IMPLANT
GLOVE BIOGEL PI IND STRL 7.5 (GLOVE) ×1 IMPLANT
GLOVE BIOGEL PI IND STRL 8 (GLOVE) ×1 IMPLANT
GLOVE BIOGEL PI INDICATOR 7.5 (GLOVE) ×2
GLOVE BIOGEL PI INDICATOR 8 (GLOVE) ×2
GLOVE ECLIPSE 8.0 STRL XLNG CF (GLOVE) ×3 IMPLANT
GLOVE ORTHO TXT STRL SZ7.5 (GLOVE) ×6 IMPLANT
GOWN SPEC L3 XXLG W/TWL (GOWN DISPOSABLE) ×3 IMPLANT
GOWN STRL REUS W/TWL LRG LVL3 (GOWN DISPOSABLE) ×3 IMPLANT
GOWN STRL REUS W/TWL XL LVL3 (GOWN DISPOSABLE) ×6 IMPLANT
KIT BASIN OR (CUSTOM PROCEDURE TRAY) ×3 IMPLANT
PACK TOTAL JOINT (CUSTOM PROCEDURE TRAY) ×3 IMPLANT
SAW OSC TIP CART 19.5X105X1.3 (SAW) ×3 IMPLANT
SUT MNCRL AB 4-0 PS2 18 (SUTURE) ×3 IMPLANT
SUT VIC AB 1 CT1 36 (SUTURE) ×9 IMPLANT
SUT VIC AB 2-0 CT1 27 (SUTURE) ×6
SUT VIC AB 2-0 CT1 TAPERPNT 27 (SUTURE) ×2 IMPLANT
SUT VLOC 180 0 24IN GS25 (SUTURE) ×3 IMPLANT
TOWEL OR 17X26 10 PK STRL BLUE (TOWEL DISPOSABLE) ×3 IMPLANT
TOWEL OR NON WOVEN STRL DISP B (DISPOSABLE) ×3 IMPLANT
TRAY FOLEY CATH 14FRSI W/METER (CATHETERS) ×3 IMPLANT

## 2014-04-08 NOTE — Transfer of Care (Signed)
Immediate Anesthesia Transfer of Care Note  Patient: Karen Macias  Procedure(s) Performed: Procedure(s): RIGHT TOTAL HIP ARTHROPLASTY ANTERIOR APPROACH (Right)  Patient Location: PACU  Anesthesia Type:General  Level of Consciousness: awake, sedated and patient cooperative  Airway & Oxygen Therapy: Patient Spontanous Breathing and Patient connected to face mask oxygen  Post-op Assessment: Report given to PACU RN and Post -op Vital signs reviewed and stable  Post vital signs: Reviewed and stable  Complications: No apparent anesthesia complications

## 2014-04-08 NOTE — Interval H&P Note (Signed)
History and Physical Interval Note:  04/08/2014 6:51 AM  Karen Macias  has presented today for surgery, with the diagnosis of RIGHT HIP AVN  The various methods of treatment have been discussed with the patient and family. After consideration of risks, benefits and other options for treatment, the patient has consented to  Procedure(s): RIGHT TOTAL HIP ARTHROPLASTY ANTERIOR APPROACH (Right) as a surgical intervention .  The patient's history has been reviewed, patient examined, no change in status, stable for surgery.  I have reviewed the patient's chart and labs.  Questions were answered to the patient's satisfaction.     Shelda Pal

## 2014-04-08 NOTE — Progress Notes (Signed)
PT Cancellation Note  Patient Details Name: Karen Macias MRN: [id number] DOB: 09-11-1981   Cancelled Treatment:    Reason Eval/Treat Not Completed: Pain limiting ability to participate   Rada Hay 04/08/2014, 3:40 PM Blanchard Kelch PT [phone]

## 2014-04-08 NOTE — Progress Notes (Signed)
Utilization review completed.  

## 2014-04-08 NOTE — Op Note (Signed)
NAME:  Karen Macias                ACCOUNT NO.: [id number]      MEDICAL RECORD NO.: [id number]      FACILITY:  [facility]      PHYSICIAN:  Shelda Pal  DATE OF BIRTH:  [birth year]     DATE OF PROCEDURE:  04/08/2014                                 OPERATIVE REPORT         PREOPERATIVE DIAGNOSIS: Right  hip avascular necrosis.      POSTOPERATIVE DIAGNOSIS:  Right hip avascular necrosis.      PROCEDURE:  Right total hip replacement through an anterior approach   utilizing DePuy THR system, component size 50mm pinnacle cup, a size 32+4 neutral   Altrex liner, a size 3 Hi Tri Lock stem with a 32+1 delta ceramic   ball.      SURGEON:  Madlyn Frankel. Charlann Boxer, M.D.      ASSISTANT:  Lanney Gins, PA-C     ANESTHESIA:  General.      SPECIMENS:  None.      COMPLICATIONS:  None.      BLOOD LOSS:  500 cc     DRAINS:  None.      INDICATION OF THE PROCEDURE:  [name] is a 33 y.o. female who had   presented to office for evaluation of right hip pain.  We have been managing right avascular changes conservatively including an attempt at head preservation with a core decompression.  Despite this she has had persistent pain, initially better after the core but then progressively worse. Radiographs revealed some concern for collapse on the lateral view.  Despite discussions aimed at joint preserving she was ready to proceed with more definitive measures.  The patient had painful limited range of   motion significantly affecting their overall quality of life.  The patient was failing to    respond to conservative measures, and at this point was ready   to proceed with more definitive measures.  Consent was obtained for   benefit of pain relief.  Specific risk of infection, DVT, component   failure, dislocation, need for revision surgery, as well discussion of   the anterior versus posterior approach were reviewed.  Consent was   obtained for benefit of anterior  pain relief through an anterior   approach.      PROCEDURE IN DETAIL:  The patient was brought to operative theater.   Once adequate anesthesia, preoperative antibiotics, 2gm Ancef administered.   The patient was positioned supine on the OSI Hanna table.  Once adequate   padding of boney process was carried out, we had predraped out the hip, and  used fluoroscopy to confirm orientation of the pelvis and position.      The right hip was then prepped and draped from proximal iliac crest to   mid thigh with shower curtain technique.      Time-out was performed identifying the patient, planned procedure, and   extremity.     An incision was then made 2 cm distal and lateral to the   anterior superior iliac spine extending over the orientation of the   tensor fascia lata muscle and sharp dissection was carried down to the   fascia of the muscle and protractor placed in the soft tissues.  The fascia was then incised.  The muscle belly was identified and swept   laterally and retractor placed along the superior neck.  Following   cauterization of the circumflex vessels and removing some pericapsular   fat, a second cobra retractor was placed on the inferior neck.  A third   retractor was placed on the anterior acetabulum after elevating the   anterior rectus.  A L-capsulotomy was along the line of the   superior neck to the trochanteric fossa, then extended proximally and   distally.  Tag sutures were placed and the retractors were then placed   intracapsular.  We then identified the trochanteric fossa and   orientation of my neck cut, confirmed this radiographically   and then made a neck osteotomy with the femur on traction.  The femoral   head was removed without difficulty or complication.  Traction was let   off and retractors were placed posterior and anterior around the   acetabulum.      The labrum and foveal tissue were debrided.  I began reaming with a 45mm   reamer and  reamed up to 49mm reamer with good bony bed preparation and a 50   cup was chosen.  The final 50mm Pinnacle cup was then impacted under fluoroscopy  to confirm the depth of penetration and orientation with respect to   abduction.  A screw was placed followed by the hole eliminator.  The final   32+4 neutral Altrex liner was impacted with good visualized rim fit.  The cup was positioned anatomically within the acetabular portion of the pelvis.      At this point, the femur was rolled at 80 degrees.  Further capsule was   released off the inferior aspect of the femoral neck.  I then   released the superior capsule proximally.  The hook was placed laterally   along the femur and elevated manually and held in position with the bed   hook.  The leg was then extended and adducted with the leg rolled to 100   degrees of external rotation.  Once the proximal femur was fully   exposed, I used a box osteotome to set orientation.  I then began   broaching with the starting chili pepper broach and passed this by hand and then broached up to 3.  With the 3 broach in place I chose a high offset neck and did a trial reduction.  The offset was appropriate, leg lengths   appeared to be equal with the +1 head ball trial, confirmed radiographically.   Given these findings, I went ahead and dislocated the hip, repositioned all   retractors and positioned the right hip in the extended and abducted position.  The final 3 Hi Tri Lock stem was   chosen and it was impacted down to the level of neck cut.  Based on this   and the trial reduction, a 32+1 delta ceramic ball was chosen and   impacted onto a clean and dry trunnion, and the hip was reduced.  The   hip had been irrigated throughout the case again at this point.  The fascia of the   tensor fascia lata muscle was then reapproximated using #1 Vicryl and #0 V-lock.  The   remaining wound was closed with 2-0 Vicryl and running 4-0 Monocryl.   The hip was cleaned,  dried, and dressed sterilely using Dermabond and   Aquacel dressing.  She was then brought   to recovery  room in stable condition tolerating the procedure well.    Lanney Gins, PA-C was present for the entirety of the case involved from   preoperative positioning, perioperative retractor management, general   facilitation of the case, as well as primary wound closure as assistant.            Madlyn Frankel Charlann Boxer, M.D.        04/08/2014 10:06 AM

## 2014-04-08 NOTE — Plan of Care (Signed)
Problem: Consults Goal: Diagnosis- Total Joint Replacement Primary Total Hip     

## 2014-04-08 NOTE — Anesthesia Postprocedure Evaluation (Signed)
Anesthesia Post Note  Patient: Engineer, drilling  Procedure(s) Performed: Procedure(s) (LRB): RIGHT TOTAL HIP ARTHROPLASTY ANTERIOR APPROACH (Right)  Anesthesia type: General  Patient location: PACU  Post pain: Pain level controlled  Post assessment: Post-op Vital signs reviewed  Last Vitals: BP 136/80  Pulse 86  Temp(Src) 36.4 C (Oral)  Resp 16  Ht 5\' 5"  (1.651 m)  Wt 234 lb (106.142 kg)  BMI 38.94 kg/m2  SpO2 98%  Post vital signs: Reviewed  Level of consciousness: sedated  Complications: No apparent anesthesia complications

## 2014-04-08 NOTE — Anesthesia Preprocedure Evaluation (Addendum)
Anesthesia Evaluation  Patient identified by MRN, date of birth, ID band Patient awake    Reviewed: Allergy & Precautions, H&P , NPO status , Patient's Chart, lab work & pertinent test results  History of Anesthesia Complications (+) PONV and history of anesthetic complications  Airway Mallampati: III TM Distance: <3 FB Neck ROM: Full    Dental no notable dental hx. (+) Dental Advisory Given   Pulmonary neg pulmonary ROS,  breath sounds clear to auscultation  Pulmonary exam normal       Cardiovascular negative cardio ROS  Rhythm:Regular Rate:Normal     Neuro/Psych  Headaches, negative psych ROS   GI/Hepatic negative GI ROS, Neg liver ROS,   Endo/Other  Morbid obesity  Renal/GU negative Renal ROS     Musculoskeletal negative musculoskeletal ROS (+)   Abdominal   Peds  Hematology negative hematology ROS (+)   Anesthesia Other Findings   Reproductive/Obstetrics negative OB ROS                           Anesthesia Physical  Anesthesia Plan  ASA: III  Anesthesia Plan:    Post-op Pain Management:    Induction: Intravenous  Airway Management Planned:   Additional Equipment:   Intra-op Plan:   Post-operative Plan:   Informed Consent: I have reviewed the patients History and Physical, chart, labs and discussed the procedure including the risks, benefits and alternatives for the proposed anesthesia with the patient or authorized representative who has indicated his/her understanding and acceptance.   Dental advisory given  Plan Discussed with: CRNA  Anesthesia Plan Comments:        Anesthesia Quick Evaluation

## 2014-04-09 DIAGNOSIS — E669 Obesity, unspecified: Secondary | ICD-10-CM | POA: Diagnosis present

## 2014-04-09 DIAGNOSIS — D500 Iron deficiency anemia secondary to blood loss (chronic): Secondary | ICD-10-CM | POA: Diagnosis not present

## 2014-04-09 LAB — CBC
HCT: 27.8 % — ABNORMAL LOW (ref 36.0–46.0)
HEMOGLOBIN: 9.4 g/dL — AB (ref 12.0–15.0)
MCH: 28.7 pg (ref 26.0–34.0)
MCHC: 33.8 g/dL (ref 30.0–36.0)
MCV: 85 fL (ref 78.0–100.0)
Platelets: 212 10*3/uL (ref 150–400)
RBC: 3.27 MIL/uL — AB (ref 3.87–5.11)
RDW: 13.3 % (ref 11.5–15.5)
WBC: 9 10*3/uL (ref 4.0–10.5)

## 2014-04-09 LAB — BASIC METABOLIC PANEL
BUN: 6 mg/dL (ref 6–23)
CO2: 25 meq/L (ref 19–32)
Calcium: 8.2 mg/dL — ABNORMAL LOW (ref 8.4–10.5)
Chloride: 103 mEq/L (ref 96–112)
Creatinine, Ser: 0.65 mg/dL (ref 0.50–1.10)
GFR calc Af Amer: >90 mL/min (ref >90)
GLUCOSE: 149 mg/dL — AB (ref 70–99)
POTASSIUM: 4.1 meq/L (ref 3.7–5.3)
Sodium: 137 mEq/L (ref 137–147)

## 2014-04-09 MED ORDER — OXYCODONE HCL 5 MG PO TABS: 5.0000 mg | ORAL_TABLET | ORAL | Status: AC | PRN

## 2014-04-09 MED ORDER — METHOCARBAMOL 500 MG PO TABS: 500.0000 mg | ORAL_TABLET | Freq: Four times a day (QID) | ORAL | Status: DC | PRN

## 2014-04-09 MED ORDER — POLYETHYLENE GLYCOL 3350 17 G PO PACK: 17.0000 g | PACK | Freq: Two times a day (BID) | ORAL | Status: AC

## 2014-04-09 MED ORDER — FERROUS SULFATE 325 (65 FE) MG PO TABS: 325.0000 mg | ORAL_TABLET | Freq: Three times a day (TID) | ORAL | Status: AC

## 2014-04-09 MED ORDER — ASPIRIN 325 MG PO TBEC: 325.0000 mg | DELAYED_RELEASE_TABLET | Freq: Two times a day (BID) | ORAL | Status: AC

## 2014-04-09 MED ORDER — DSS 100 MG PO CAPS: 100.0000 mg | ORAL_CAPSULE | Freq: Two times a day (BID) | ORAL | Status: AC

## 2014-04-09 NOTE — Progress Notes (Signed)
OT Cancellation Note  Patient Details Name: Cindee Mohr MRN: [id number] DOB: September 14, 1981   Cancelled Treatment:    Reason Eval/Treat Not Completed: Other (comment).  Checked on pt earlier this afternoon & was awaiting pain medication.  Did not have time to check back again.  Will complete OT evaluation tomorrow.    Marica Otter 04/09/2014, 3:45 PM Marica Otter, OTR/L [phone] 04/09/2014

## 2014-04-09 NOTE — Plan of Care (Signed)
Problem: Consults Goal: Diagnosis- Total Joint Replacement Outcome: Completed/Met Date Met:  04/09/14 Primary Total Hip RIGHT, Anterior

## 2014-04-09 NOTE — Evaluation (Signed)
Physical Therapy Evaluation Patient Details Name: Karen Macias MRN: [id number] DOB: [birth year] Today's Date: 04/09/2014   History of Present Illness  R THR - Ant Dir  Clinical Impression  Pt s/p R THR presents with decreased R LE strength/ROM and post op pain limiting functional mobility.  Pt should progress to d//c home with family assist and HHPT follow up.   Follow Up Recommendations Home health PT    Equipment Recommendations  None recommended by PT    Recommendations for Other Services OT consult     Precautions / Restrictions Precautions Precautions: Fall Restrictions Weight Bearing Restrictions: No Other Position/Activity Restrictions: WBAT      Mobility  Bed Mobility Overal bed mobility: +2 for physical assistance             General bed mobility comments: cues for sequence and use of L LE to self assist  Transfers Overall transfer level: Needs assistance Equipment used: Rolling walker (2 wheeled) Transfers: Sit to/from Stand Sit to Stand: Min assist;Mod assist;+2 physical assistance         General transfer comment: cues for LE management and use of UEs to self assist  Ambulation/Gait Ambulation/Gait assistance: Min assist;Mod assist;+2 safety/equipment Ambulation Distance (Feet): 28 Feet Assistive device: Rolling walker (2 wheeled) Gait Pattern/deviations: Step-to pattern;Decreased step length - right;Decreased step length - left;Shuffle;Trunk flexed Gait velocity: decr   General Gait Details: cues for sequence, posture, stride length and position from [employer]            Wheelchair Mobility    Modified Rankin (Stroke Patients Only)       Balance                                             Pertinent Vitals/Pain 8/10; premed, ice packs provided    Home Living Family/patient expects to be discharged to:: Private residence Living Arrangements: Parent Available Help at Discharge: Family Type of Home:  House Home Access: Stairs to enter Entrance Stairs-Rails: Doctor, general practice of Steps: 4 Home Layout: One level Home Equipment: Environmental consultant - 2 wheels      Prior Function Level of Independence: Independent               Hand Dominance   Dominant Hand: Right    Extremity/Trunk Assessment   Upper Extremity Assessment: Overall WFL for tasks assessed           Lower Extremity Assessment: RLE deficits/detail RLE Deficits / Details: Hip strength 2/5 with AAROM at hip to 70 flex and 10 abd    Cervical / Trunk Assessment: Normal  Communication   Communication: No difficulties  Cognition Arousal/Alertness: Awake/alert Behavior During Therapy: WFL for tasks assessed/performed Overall Cognitive Status: Within Functional Limits for tasks assessed                      General Comments      Exercises Total Joint Exercises Ankle Circles/Pumps: AROM;15 reps;Supine;Both Quad Sets: AROM;Both;10 reps;Supine Heel Slides: AAROM;Right;15 reps;Supine Hip ABduction/ADduction: AAROM;Right;10 reps;Supine      Assessment/Plan    PT Assessment Patient needs continued PT services  PT Diagnosis Difficulty walking   PT Problem List Decreased strength;Decreased range of motion;Decreased activity tolerance;Decreased mobility;Decreased knowledge of use of DME;Obesity;Pain  PT Treatment Interventions DME instruction;Gait training;Stair training;Functional mobility training;Therapeutic activities;Therapeutic exercise;Patient/family education   PT Goals (Current goals can be  found in the Care Plan section) Acute Rehab PT Goals Patient Stated Goal: Resume previous lifestyle with decreased pain PT Goal Formulation: With patient Time For Goal Achievement: 04/16/14 Potential to Achieve Goals: Good    Frequency 7X/week   Barriers to discharge        Co-evaluation               End of Session Equipment Utilized During Treatment: Gait belt Activity Tolerance:  Patient tolerated treatment well Patient left: in chair;with call bell/phone within reach Nurse Communication: Mobility status         Time: [id number] PT Time Calculation (min): 27 min   Charges:   PT Evaluation $Initial PT Evaluation Tier I: 1 Procedure PT Treatments $Gait Training: 8-22 mins $Therapeutic Exercise: 8-22 mins   PT G Codes:          Brien FewHunter P Makahla Kiser 04/09/2014, 2:21 PM

## 2014-04-09 NOTE — Progress Notes (Signed)
[facility]  [facility] is providing the following services: Commode (patient declined RW - already has one at home)  If patient discharges after hours, please call [phone].   Renard Hamper 04/09/2014, 9:35 AM

## 2014-04-09 NOTE — Progress Notes (Signed)
Physical Therapy Treatment Patient Details Name: Karen Macias MRN: [id number] DOB: 06-Oct-1981 Today's Date: 03-May-2014    History of Present Illness R THR - Ant Dir    PT Comments    Pt pleasant and cooperative but ltd by c/o pain and with increased time required for all tasks  Follow Up Recommendations  Home health PT     Equipment Recommendations  None recommended by PT    Recommendations for Other Services OT consult     Precautions / Restrictions Precautions Precautions: Fall Restrictions Weight Bearing Restrictions: No Other Position/Activity Restrictions: WBAT    Mobility  Bed Mobility Overal bed mobility: +2 for physical assistance;Needs Assistance Bed Mobility: Sit to Supine       Sit to supine: Min assist;Mod assist;+2 for physical assistance   General bed mobility comments: cues for sequence and use of L LE to self assist  Transfers Overall transfer level: Needs assistance Equipment used: Rolling walker (2 wheeled) Transfers: Sit to/from Stand Sit to Stand: Min assist;Mod assist;+2 physical assistance         General transfer comment: cues for LE management and use of UEs to self assist  Ambulation/Gait Ambulation/Gait assistance: Min assist;+2 safety/equipment Ambulation Distance (Feet): 58 Feet Assistive device: Rolling walker (2 wheeled) Gait Pattern/deviations: Step-to pattern;Decreased step length - right;Decreased step length - left;Shuffle;Trunk flexed Gait velocity: decr   General Gait Details: cues for sequence, posture, stride length and position from [employer]            Wheelchair Mobility    Modified Rankin (Stroke Patients Only)       Balance                                    Cognition Arousal/Alertness: Awake/alert Behavior During Therapy: WFL for tasks assessed/performed Overall Cognitive Status: Within Functional Limits for tasks assessed                      Exercises       General Comments        Pertinent Vitals/Pain 7/10; premed, ice pack provided    Home Living                      Prior Function            PT Goals (current goals can now be found in the care plan section) Acute Rehab PT Goals Patient Stated Goal: Resume previous lifestyle with decreased pain PT Goal Formulation: With patient Time For Goal Achievement: 04/16/14 Potential to Achieve Goals: Good Progress towards PT goals: Progressing toward goals    Frequency  7X/week    PT Plan Current plan remains appropriate    Co-evaluation             End of Session Equipment Utilized During Treatment: Gait belt Activity Tolerance: Patient tolerated treatment well Patient left: in bed;with call bell/phone within reach     Time: 6237-6283 PT Time Calculation (min): 28 min  Charges:  $Gait Training: 23-37 mins                    G Codes:      Brien Few 03-May-2014, 5:33 PM

## 2014-04-09 NOTE — Progress Notes (Signed)
   Subjective: 1 Day Post-Op Procedure(s) (LRB): RIGHT TOTAL HIP ARTHROPLASTY ANTERIOR APPROACH (Right)   Patient reports pain as mild, pain controlled. No events throughout the night. She says that she , "does feel quite herself this morning." Denies any nausea or vomiting. Denies any SOB or CP. Discussed how anesthesia can make you feel and she feel that might be the culprit.   Objective:   VITALS:   Filed Vitals:   04/09/14 0609  BP: 103/62  Pulse: 69  Temp: 98.2 F (36.8 C)  Resp: 16    Neurovascular intact Dorsiflexion/Plantar flexion intact Incision: dressing C/D/I No cellulitis present Compartment soft  LABS  Recent Labs  04/09/14 0430  HGB 9.4*  HCT 27.8*  WBC 9.0  PLT 212     Recent Labs  04/09/14 0430  NA 137  K 4.1  BUN 6  CREATININE 0.65  GLUCOSE 149*     Assessment/Plan: 1 Day Post-Op Procedure(s) (LRB): RIGHT TOTAL HIP ARTHROPLASTY ANTERIOR APPROACH (Right) Foley cath d/c'ed Advance diet Up with therapy D/C IV fluids Discharge home with home health eventually, when ready  Expected ABLA  Treated with iron and will observe  Obese (BMI 30-39.9) Estimated body mass index is 38.94 kg/(m^2) as calculated from the following:   Height as of this encounter: 5\' 5"  (1.651 m).   Weight as of this encounter: 106.142 kg (234 lb). Patient also counseled that weight may inhibit the healing process Patient counseled that losing weight will help with future health issues      Anastasio Auerbach. Michaele Amundson   PAC  04/09/2014, 9:26 AM

## 2014-04-10 LAB — CBC
HEMATOCRIT: 28.5 % — AB (ref 36.0–46.0)
HEMOGLOBIN: 9.1 g/dL — AB (ref 12.0–15.0)
MCH: 27.5 pg (ref 26.0–34.0)
MCHC: 31.9 g/dL (ref 30.0–36.0)
MCV: 86.1 fL (ref 78.0–100.0)
Platelets: 207 10*3/uL (ref 150–400)
RBC: 3.31 MIL/uL — ABNORMAL LOW (ref 3.87–5.11)
RDW: 13.6 % (ref 11.5–15.5)
WBC: 10.7 10*3/uL — ABNORMAL HIGH (ref 4.0–10.5)

## 2014-04-10 LAB — BASIC METABOLIC PANEL
BUN: 6 mg/dL (ref 6–23)
CALCIUM: 8.5 mg/dL (ref 8.4–10.5)
CO2: 26 meq/L (ref 19–32)
Chloride: 102 mEq/L (ref 96–112)
Creatinine, Ser: 0.55 mg/dL (ref 0.50–1.10)
GFR calc Af Amer: >90 mL/min (ref >90)
GFR calc non Af Amer: >90 mL/min (ref >90)
GLUCOSE: 112 mg/dL — AB (ref 70–99)
Potassium: 3.7 mEq/L (ref 3.7–5.3)
SODIUM: 138 meq/L (ref 137–147)

## 2014-04-10 NOTE — Progress Notes (Signed)
Physical Therapy Treatment Patient Details Name: Karen Macias MRN: [id number] DOB: Aug 28, 1981 Today's Date: 22-Apr-2014    History of Present Illness R THR - Ant Dir    PT Comments    Progressing well, hopes to d/c this pm   Follow Up Recommendations  Home health PT     Equipment Recommendations  None recommended by PT    Recommendations for Other Services OT consult     Precautions / Restrictions Precautions Precautions: Fall Restrictions Weight Bearing Restrictions: No Other Position/Activity Restrictions: WBAT    Mobility  Bed Mobility Overal bed mobility: Needs Assistance Bed Mobility: Supine to Sit     Supine to sit: Min assist     General bed mobility comments: cues for sequence; assistance with LLE  Transfers Overall transfer level: Needs assistance Equipment used: Rolling walker (2 wheeled) Transfers: Sit to/from Stand Sit to Stand: Min guard         General transfer comment: cues for hand placement  Ambulation/Gait Ambulation/Gait assistance: Min guard Ambulation Distance (Feet): 100 Feet Assistive device: Rolling walker (2 wheeled) Gait Pattern/deviations: Step-to pattern;Step-through pattern;Decreased step length - right;Decreased step length - left;Shuffle;Trunk flexed Gait velocity: decr   General Gait Details: cues for sequence, posture, stride length and position from [employer]            Wheelchair Mobility    Modified Rankin (Stroke Patients Only)       Balance                                    Cognition Arousal/Alertness: Awake/alert Behavior During Therapy: WFL for tasks assessed/performed Overall Cognitive Status: Within Functional Limits for tasks assessed                      Exercises Total Joint Exercises Ankle Circles/Pumps: AROM;15 reps;Supine;Both Quad Sets: AROM;Both;10 reps;Supine Heel Slides: AAROM;Right;Supine;20 reps Hip ABduction/ADduction: AAROM;Right;Supine;15  reps    General Comments        Pertinent Vitals/Pain 6/10; premed, ice packs provided    Home Living                      Prior Function            PT Goals (current goals can now be found in the care plan section) Acute Rehab PT Goals Patient Stated Goal: Resume previous lifestyle with decreased pain PT Goal Formulation: With patient Time For Goal Achievement: 04/16/14 Potential to Achieve Goals: Good Progress towards PT goals: Progressing toward goals    Frequency  7X/week    PT Plan Current plan remains appropriate    Co-evaluation             End of Session Equipment Utilized During Treatment: Gait belt Activity Tolerance: Patient tolerated treatment well Patient left: in chair;with call bell/phone within reach     Time: 1208-1238 PT Time Calculation (min): 30 min  Charges:  $Gait Training: 8-22 mins $Therapeutic Exercise: 8-22 mins                    G Codes:      Brien Few 04-22-2014, 4:30 PM

## 2014-04-10 NOTE — Evaluation (Signed)
Occupational Therapy Evaluation Patient Details Name: Karen Macias MRN: [id number] DOB: 1981-04-29 Today's Date: 04/10/2014    History of Present Illness R THR - Ant Dir   Clinical Impression   This 33 year old female was admitted for the above surgery. All education was completed.  Pt does not need any further OT at this time.      Follow Up Recommendations    No OT follow up   Equipment Recommendations    3:1 delivered   Recommendations for Other Services       Precautions / Restrictions Precautions Precautions: Fall Restrictions Other Position/Activity Restrictions: WBAT      Mobility Bed Mobility           Sit to supine: Min assist   General bed mobility comments: cues for sequence; assistance with LLE  Transfers     Transfers: Sit to/from Stand Sit to Stand: Min guard         General transfer comment: cues for hand placement    Balance                                            ADL Overall ADL's : Needs assistance/impaired     Grooming: Oral care;Supervision/safety;Standing   Upper Body Bathing: Supervision/ safety;Standing   Lower Body Bathing: Minimal assistance;Sit to/from stand   Upper Body Dressing : Set up;Sitting   Lower Body Dressing: Moderate assistance;Sit to/from stand   Toilet Transfer: Min guard;Ambulation;BSC   Toileting- Architect and Hygiene: Min guard;Sit to/from stand         General ADL Comments: completed adl in bathroom.  Family will assist with ADLs, not interested in AE.  Pt has 3:1 in room--educated on adjusting and  height, removing back bar if needed, and using splash guard.  Showed photo of tub bench if needed.  Pt will sponge bathe intially     Vision                     Perception     Praxis      Pertinent Vitals/Pain 8/10 at end of session  Repositioned with ice     Hand Dominance Right   Extremity/Trunk Assessment Upper Extremity Assessment Upper  Extremity Assessment: Overall WFL for tasks assessed           Communication Communication Communication: No difficulties   Cognition Arousal/Alertness: Awake/alert Behavior During Therapy: WFL for tasks assessed/performed Overall Cognitive Status: Within Functional Limits for tasks assessed                     General Comments       Exercises       Shoulder Instructions      Home Living Family/patient expects to be discharged to:: Private residence Living Arrangements: Parent Available Help at Discharge: Family Type of Home: House Home Access: Stairs to enter Secretary/administrator of Steps: 4         Bathroom Shower/Tub: Tub/shower unit Shower/tub characteristics: Curtain Firefighter: Handicapped height     Home Equipment: Environmental consultant - 2 wheels   Additional Comments: 3:1 delivered to room,      Prior Functioning/Environment Level of Independence: Independent             OT Diagnosis:     OT Problem List:     OT Treatment/Interventions:  OT Goals(Current goals can be found in the care plan section)    OT Frequency:     Barriers to D/C:            Co-evaluation              End of Session    Activity Tolerance: Patient limited by fatigue Patient left: in bed;with call bell/phone within reach;with family/visitor present   Time: 0934-1010 OT Time Calculation (min): 36 min Charges:  OT General Charges $OT Visit: 1 Procedure OT Evaluation $Initial OT Evaluation Tier I: 1 Procedure OT Treatments $Self Care/Home Management : 23-37 mins G-Codes:    Karen Macias 04/10/2014, 10:55 AM  Karen Macias, OTR/L [id number] 04/10/2014

## 2014-04-10 NOTE — Progress Notes (Signed)
Physical Therapy Treatment Patient Details Name: Karen Macias MRN: [id number] DOB: Oct 06, 1981 Today's Date: 2014/05/06    History of Present Illness R THR - Ant Dir    PT Comments    Pt continues cooperative and progressing toward d/c.  Reviewed stairs and car transfers this session.  Follow Up Recommendations  Home health PT     Equipment Recommendations  None recommended by PT    Recommendations for Other Services OT consult     Precautions / Restrictions Precautions Precautions: Fall Restrictions Weight Bearing Restrictions: No Other Position/Activity Restrictions: WBAT    Mobility  Bed Mobility Overal bed mobility: Needs Assistance Bed Mobility: Supine to Sit     Supine to sit: Min assist     General bed mobility comments: cues for sequence; assistance with LLE  Transfers Overall transfer level: Needs assistance Equipment used: Rolling walker (2 wheeled) Transfers: Sit to/from Stand Sit to Stand: Supervision         General transfer comment: cues for hand placement  Ambulation/Gait Ambulation/Gait assistance: Min guard;Supervision Ambulation Distance (Feet): 70 Feet Assistive device: Rolling walker (2 wheeled) Gait Pattern/deviations: Step-to pattern;Step-through pattern;Decreased step length - right;Decreased step length - left;Shuffle Gait velocity: decr   General Gait Details: min cues for sequence, posture, stride length and position from RW   Stairs Stairs: Yes Stairs assistance: Min assist Stair Management: One rail Left;Step to pattern;Forwards;With crutches Number of Stairs: 4 General stair comments: cues for sequence and foot/crutch placement  Wheelchair Mobility    Modified Rankin (Stroke Patients Only)       Balance                                    Cognition Arousal/Alertness: Awake/alert Behavior During Therapy: WFL for tasks assessed/performed Overall Cognitive Status: Within Functional Limits for  tasks assessed                      Exercises Total Joint Exercises Ankle Circles/Pumps: AROM;15 reps;Supine;Both Quad Sets: AROM;Both;10 reps;Supine Heel Slides: AAROM;Right;Supine;20 reps Hip ABduction/ADduction: AAROM;Right;Supine;15 reps    General Comments        Pertinent Vitals/Pain 5/10; premed, ice packs provided    Home Living                      Prior Function            PT Goals (current goals can now be found in the care plan section) Acute Rehab PT Goals Patient Stated Goal: Resume previous lifestyle with decreased pain PT Goal Formulation: With patient Time For Goal Achievement: 04/16/14 Potential to Achieve Goals: Good Progress towards PT goals: Progressing toward goals    Frequency  7X/week    PT Plan Current plan remains appropriate    Co-evaluation             End of Session Equipment Utilized During Treatment: Gait belt Activity Tolerance: Patient tolerated treatment well Patient left: in chair;with call bell/phone within reach     Time: 5631-4970 PT Time Calculation (min): 25 min  Charges:  $Gait Training: 8-22 mins $Therapeutic Exercise: 8-22 mins $Therapeutic Activity: 8-22 mins                    G Codes:      Brien Few 05/06/2014, 4:34 PM

## 2014-04-10 NOTE — Progress Notes (Signed)
Patient ID: Karen Macias, female   DOB: [birth year], 33 y.o.   MRN: [id number] Subjective: 2 Days Post-Op Procedure(s) (LRB): RIGHT TOTAL HIP ARTHROPLASTY ANTERIOR APPROACH (Right)    Patient reports pain as mild to moderate.  Nausea improved.  Ready to keep things going  Objective:   VITALS:   Filed Vitals:   04/09/14 2147  BP: 129/66  Pulse: 78  Temp: 98 F (36.7 C)  Resp: 18    Neurovascular intact Incision: dressing C/D/I  LABS  Recent Labs  04/09/14 0430 04/10/14 0420  HGB 9.4* 9.1*  HCT 27.8* 28.5*  WBC 9.0 10.7*  PLT 212 207     Recent Labs  04/09/14 0430 04/10/14 0420  NA 137 138  K 4.1 3.7  BUN 6 6  CREATININE 0.65 0.55  GLUCOSE 149* 112*    No results found for this basename: LABPT, INR,  in the last 72 hours   Assessment/Plan: 2 Days Post-Op Procedure(s) (LRB): RIGHT TOTAL HIP ARTHROPLASTY ANTERIOR APPROACH (Right)   Up with therapy Discharge home with home health today after therapy sessions  Follow up in 2 weeks May shower

## 2014-04-24 IMAGING — CR DG CHEST 1V
1 series · 1 of 1 positions shown · non-contrast
Comparison: 09/18/2008

CLINICAL DATA: Postop hip decompression

CHEST - 1 VIEW

[AP]
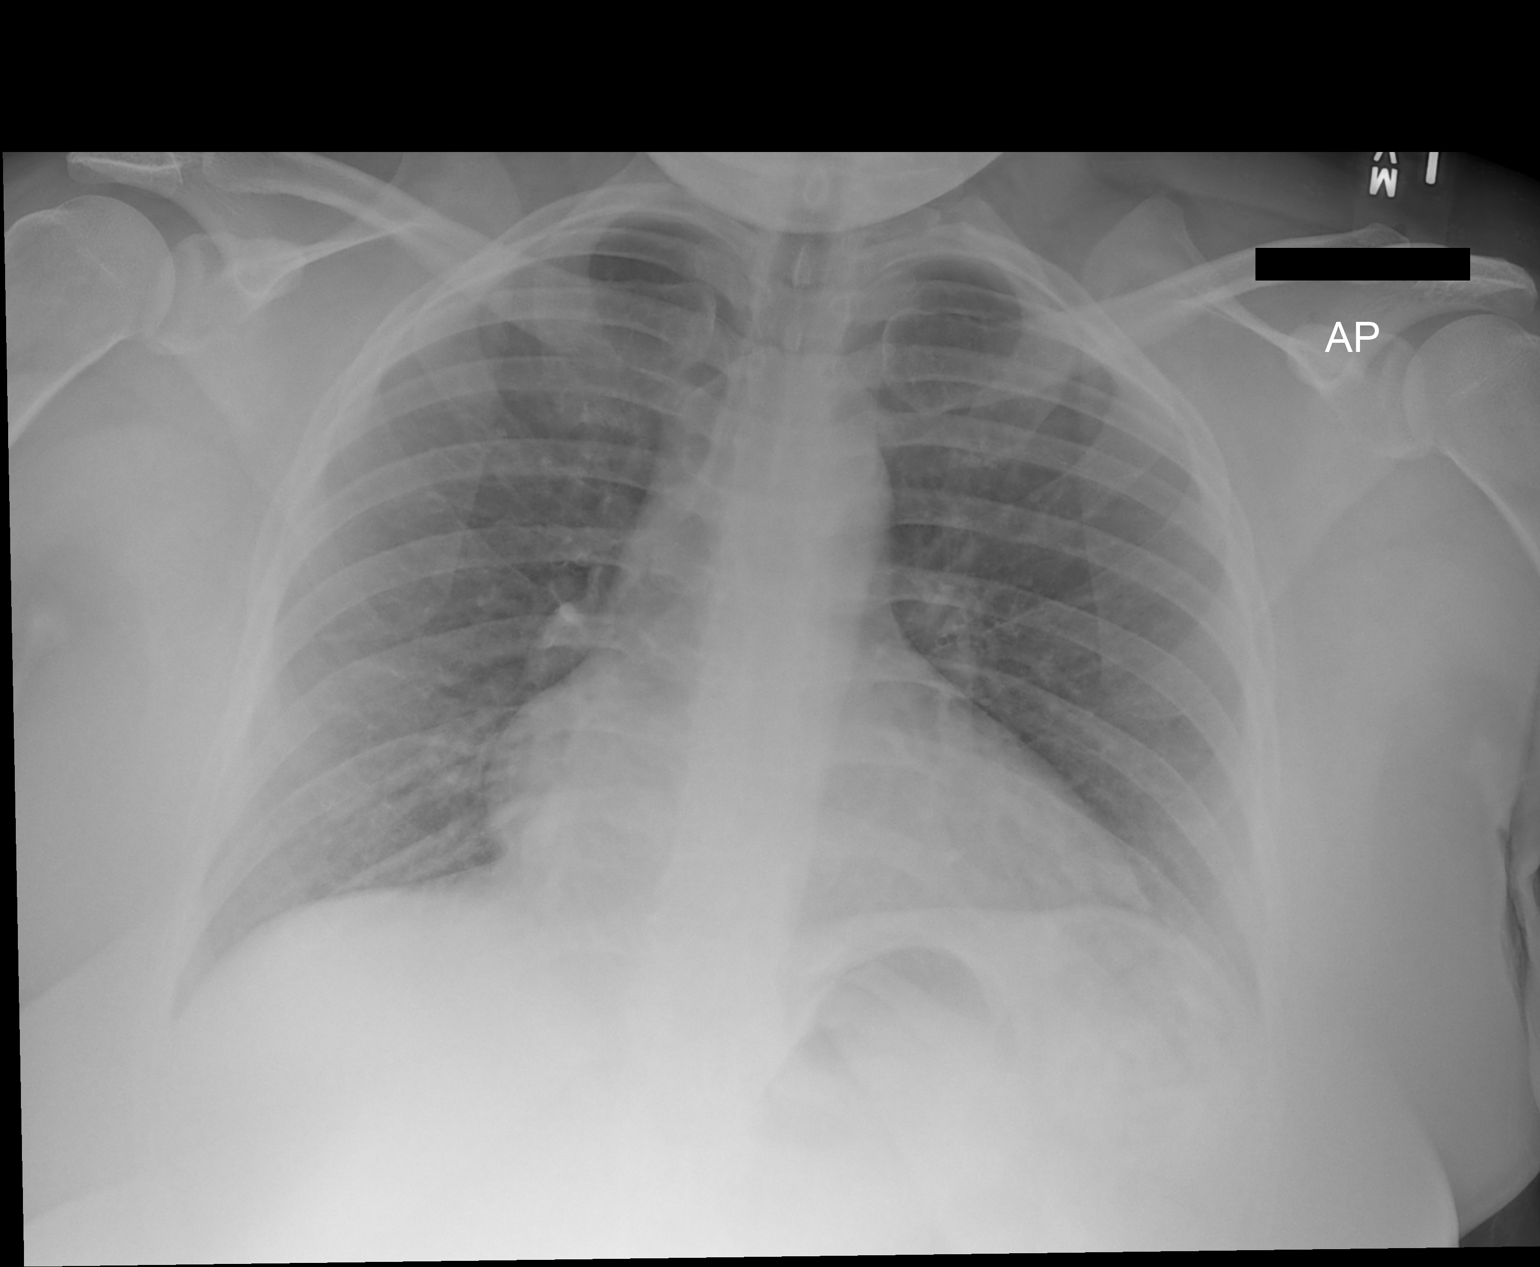

[1 of 1 positions shown; findings below may reference images not displayed]

FINDINGS: Low lung volumes.  No frank interstitial edema.  No
pleural effusion or pneumothorax.

Cardiomegaly.
IMPRESSION: No evidence of acute cardiopulmonary disease.

## 2015-04-25 IMAGING — CR DG PORTABLE PELVIS
1 series · 1 of 1 positions shown · non-contrast
Comparison: None.

CLINICAL DATA: Status post right hip replacement

EXAM:
PORTABLE PELVIS 1-2 VIEWS

[AP]
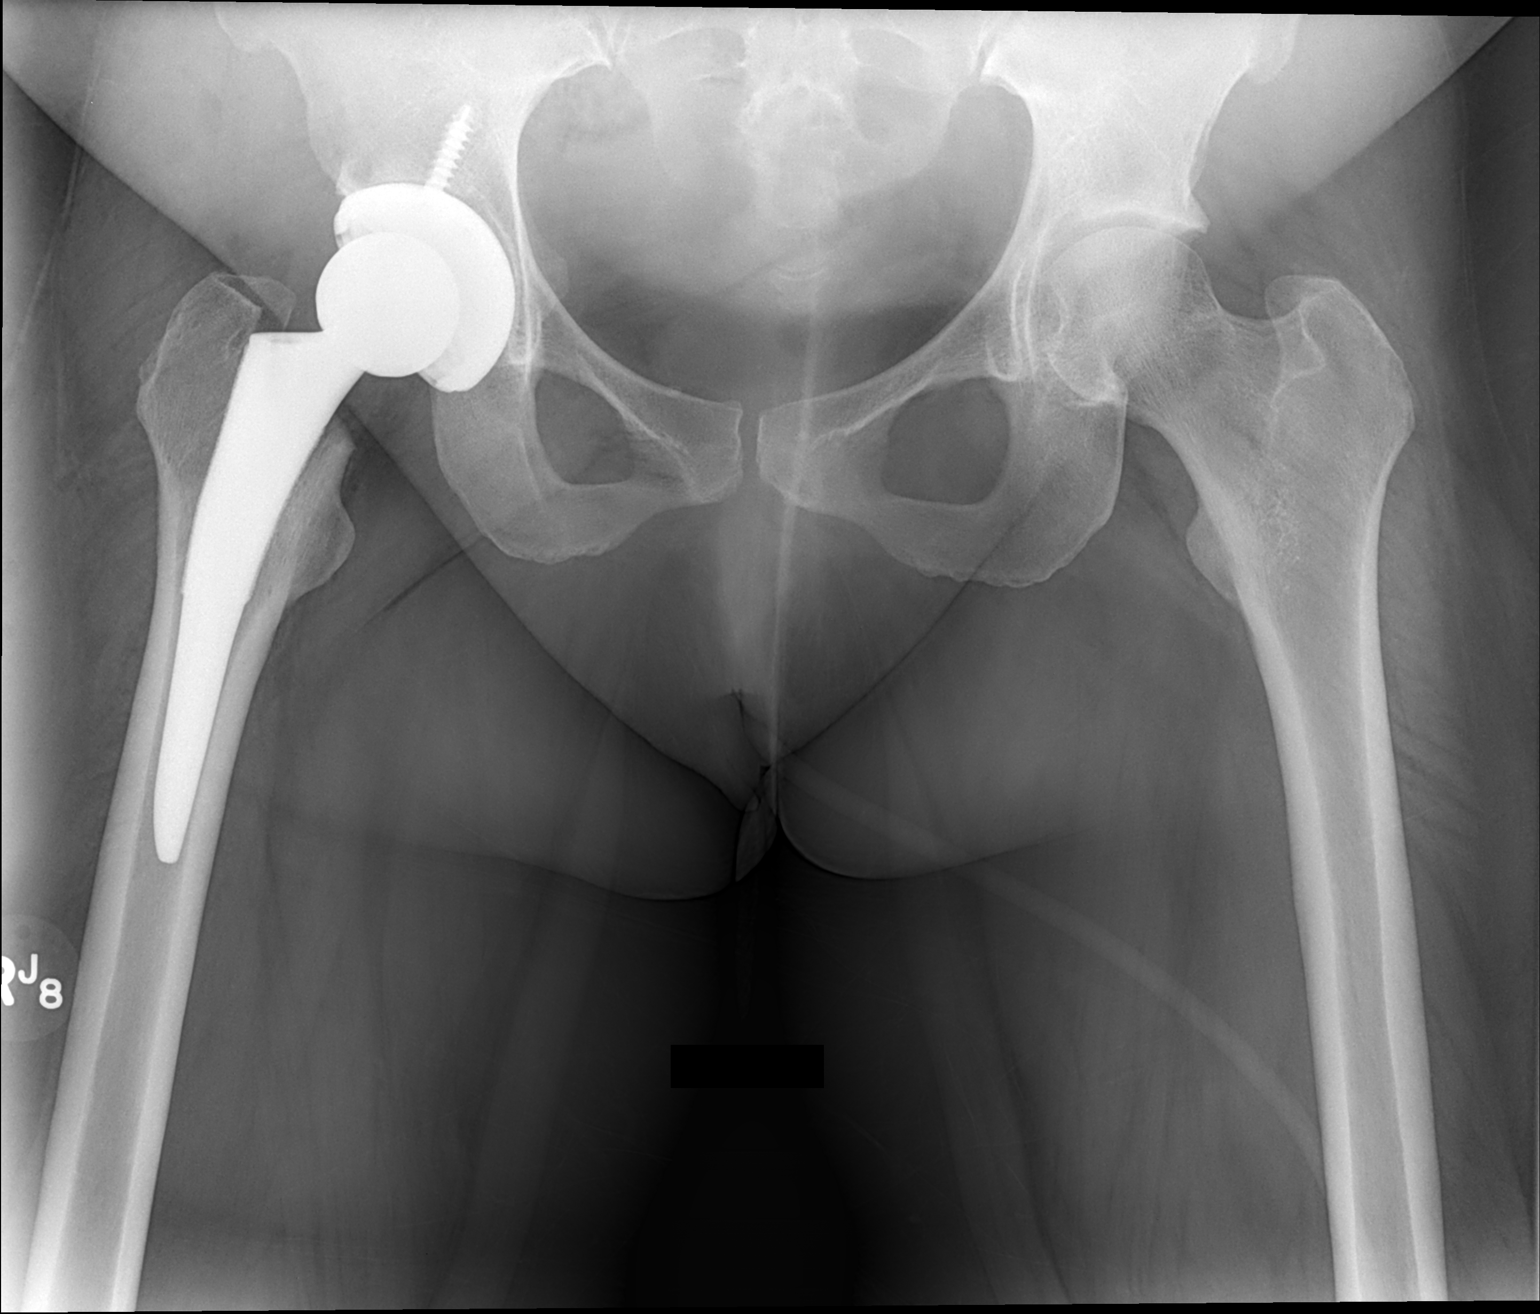

[1 of 1 positions shown; findings below may reference images not displayed]

FINDINGS: Right hip replacement is not visualized. No acute abnormality is
seen. The pelvic ring is intact. No gross soft tissue changes are
noted.
IMPRESSION: Status post right hip replacement.

## 2015-04-25 IMAGING — CR DG HIP 1V PORT*R*
1 series · 1 of 1 positions shown · non-contrast
Comparison: None.

CLINICAL DATA: Status post right hip replacement

EXAM:
PORTABLE RIGHT HIP - 1 VIEW

[AP]
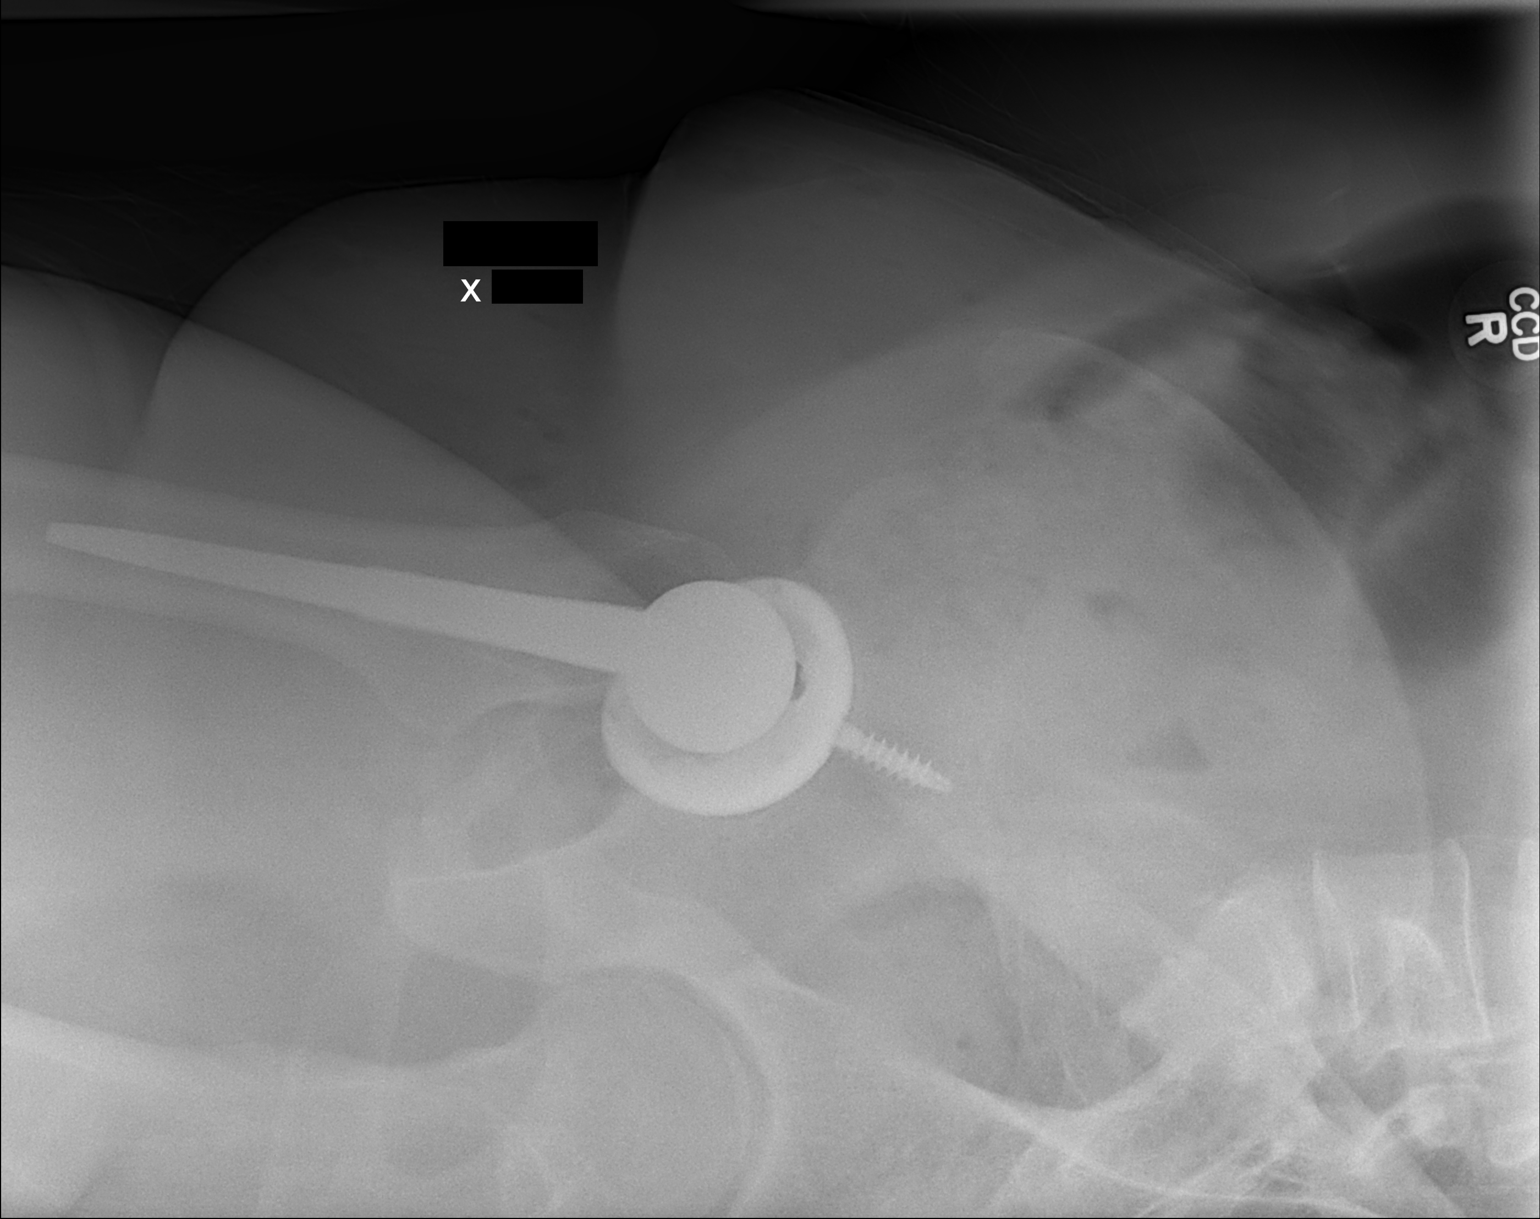

[1 of 1 positions shown; findings below may reference images not displayed]

FINDINGS: The right hip placement is now seen.  No acute abnormality is noted.

## 2017-04-25 ENCOUNTER — Ambulatory Visit: Payer: Self-pay | Admitting: Student

## 2017-04-25 DIAGNOSIS — H1089 Other conjunctivitis: Secondary | ICD-10-CM | POA: Diagnosis not present

## 2017-06-30 ENCOUNTER — Other Ambulatory Visit: Payer: Self-pay | Admitting: Physician Assistant

## 2017-06-30 ENCOUNTER — Ambulatory Visit
Admission: RE | Admit: 2017-06-30 | Discharge: 2017-06-30 | Disposition: A | Discharge status: Home / Self Care | Payer: Self-pay | Source: Ambulatory Visit | Attending: Physician Assistant | Admitting: Physician Assistant

## 2017-06-30 DIAGNOSIS — Z471 Aftercare following joint replacement surgery: Secondary | ICD-10-CM | POA: Diagnosis not present

## 2017-06-30 DIAGNOSIS — M545 Low back pain: Secondary | ICD-10-CM | POA: Diagnosis not present

## 2017-06-30 DIAGNOSIS — Z09 Encounter for follow-up examination after completed treatment for conditions other than malignant neoplasm: Secondary | ICD-10-CM

## 2017-06-30 DIAGNOSIS — Z96641 Presence of right artificial hip joint: Secondary | ICD-10-CM | POA: Diagnosis not present

## 2017-06-30 DIAGNOSIS — K56609 Unspecified intestinal obstruction, unspecified as to partial versus complete obstruction: Secondary | ICD-10-CM | POA: Diagnosis not present

## 2017-07-14 DIAGNOSIS — Z96641 Presence of right artificial hip joint: Secondary | ICD-10-CM | POA: Diagnosis not present

## 2017-07-14 DIAGNOSIS — M545 Low back pain: Secondary | ICD-10-CM | POA: Diagnosis not present

## 2017-07-14 DIAGNOSIS — Z471 Aftercare following joint replacement surgery: Secondary | ICD-10-CM | POA: Diagnosis not present

## 2018-10-01 DIAGNOSIS — R5383 Other fatigue: Secondary | ICD-10-CM | POA: Diagnosis not present

## 2018-10-01 DIAGNOSIS — G4700 Insomnia, unspecified: Secondary | ICD-10-CM | POA: Diagnosis not present

## 2018-12-12 DIAGNOSIS — Z01419 Encounter for gynecological examination (general) (routine) without abnormal findings: Secondary | ICD-10-CM | POA: Diagnosis not present

## 2018-12-12 DIAGNOSIS — Z6841 Body Mass Index (BMI) 40.0 and over, adult: Secondary | ICD-10-CM | POA: Diagnosis not present

## 2018-12-14 DIAGNOSIS — D539 Nutritional anemia, unspecified: Secondary | ICD-10-CM | POA: Diagnosis not present

## 2019-02-15 DIAGNOSIS — H6090 Unspecified otitis externa, unspecified ear: Secondary | ICD-10-CM | POA: Diagnosis not present

## 2020-12-21 ENCOUNTER — Other Ambulatory Visit: Payer: Self-pay

## 2020-12-23 ENCOUNTER — Encounter: Payer: Self-pay | Admitting: Endocrinology

## 2020-12-23 ENCOUNTER — Other Ambulatory Visit: Payer: Self-pay

## 2020-12-23 ENCOUNTER — Ambulatory Visit (INDEPENDENT_AMBULATORY_CARE_PROVIDER_SITE_OTHER): Payer: No Typology Code available for payment source | Admitting: Endocrinology

## 2020-12-23 DIAGNOSIS — E0590 Thyrotoxicosis, unspecified without thyrotoxic crisis or storm: Secondary | ICD-10-CM

## 2020-12-23 MED ORDER — METHIMAZOLE 10 MG PO TABS: 30.0000 mg | ORAL_TABLET | Freq: Two times a day (BID) | ORAL | 3 refills | Status: AC

## 2020-12-23 MED ORDER — PROPRANOLOL HCL 10 MG PO TABS: 10.0000 mg | ORAL_TABLET | Freq: Two times a day (BID) | ORAL | 2 refills | Status: DC

## 2020-12-23 NOTE — Patient Instructions (Addendum)
Please continue the same propranolol I have sent a prescription to your pharmacy, to increase the methimazole. If ever you have fever while taking methimazole, stop it and call us, even if the reason is obvious, because of the risk of a rare side-effect.   It is best to never miss the methimazole.  However, if you do miss it, next best is to double up the next time.    Please come back for a follow-up appointment in 3 weeks, but video if you prefer.

## 2020-12-23 NOTE — Progress Notes (Signed)
Subjective:    Patient ID: Karen Macias, female    DOB: [birth year], 40 y.o.   MRN: [id number]  HPI Pt is referred by Maurie Boettcher, NP, for hyperthyroidism.  she was dx'ed with hyperthyroidism 10 days ago.  she has never been on therapy for this.  she has never had XRT to the anterior neck, or thyroid surgery.  she has never had thyroid imaging.  she does not consume kelp or any other non-prescribed thyroid medication.  she has never been on amiodarone.  Pt reports few weeks of palpitations, hair loss, tremor, doe, diaphoresis, anxiety, heat intolerance, and fatigue.   Past Medical History:  Diagnosis Date  . Avascular necrosis (HCC)    RT HIP  . Complication of anesthesia   . Headache(784.0)    HISTORY OF MIGRAINES  . Insomnia   . PONV (postoperative nausea and vomiting)     Past Surgical History:  Procedure Laterality Date  . APPENDECTOMY    . CHOLECYSTECTOMY    . DECOMPRESSION HIP-CORE Right 04/05/2013   Procedure: RIGHT HIP CORE - DECOMPRESSION;  Surgeon: Shelda Pal, MD;  Location: [facility];  Service: Orthopedics;  Laterality: Right;  . TOTAL HIP ARTHROPLASTY Right 04/08/2014   Procedure: RIGHT TOTAL HIP ARTHROPLASTY ANTERIOR APPROACH;  Surgeon: Shelda Pal, MD;  Location: [facility];  Service: Orthopedics;  Laterality: Right;  . TUBAL LIGATION      Social History   Socioeconomic History  . Marital status: Single    Spouse name: Not on file  . Number of children: Not on file  . Years of education: Not on file  . Highest education level: Not on file  Occupational History  . Not on file  Tobacco Use  . Smoking status: Never Smoker  . Smokeless tobacco: Never Used  Substance and Sexual Activity  . Alcohol use: No  . Drug use: No  . Sexual activity: Not on file  Other Topics Concern  . Not on file  Social History Narrative  . Not on file   Social Determinants of Health   Financial Resource Strain: Not on file  Food Insecurity: Not on file   Transportation Needs: Not on file  Physical Activity: Not on file  Stress: Not on file  Social Connections: Not on file  Intimate Partner Violence: Not on file    Current Outpatient Medications on File Prior to Visit  Medication Sig Dispense Refill  . docusate sodium 100 MG CAPS Take 100 mg by mouth 2 (two) times daily. 10 capsule 0  . ferrous sulfate 325 (65 FE) MG tablet Take 1 tablet (325 mg total) by mouth 3 (three) times daily after meals.  3  . methocarbamol (ROBAXIN) 500 MG tablet Take 1 tablet (500 mg total) by mouth every 6 (six) hours as needed for muscle spasms. 50 tablet 0  . oxyCODONE (OXY IR/ROXICODONE) 5 MG immediate release tablet Take 1-3 tablets (5-15 mg total) by mouth every 4 (four) hours as needed for severe pain. 120 tablet 0  . polyethylene glycol (MIRALAX / GLYCOLAX) packet Take 17 g by mouth 2 (two) times daily. 14 each 0  . zolpidem (AMBIEN) 10 MG tablet Take 10 mg by mouth at bedtime as needed for sleep.     No current facility-administered medications on file prior to visit.    Allergies  Allergen Reactions  . Darvocet [Propoxyphene N-Acetaminophen] Hives    Family History  Problem Relation Age of Onset  . Thyroid disease Neg Hx  BP 120/80 (BP Location: Right Arm, Patient Position: Sitting, Cuff Size: Large)   Pulse (!) 103   Ht 5\' 5"  (1.651 m)   Wt 263 lb 12.8 oz (119.7 kg)   SpO2 98%   BMI 43.90 kg/m   Review of Systems denies sob.  She has lost a few lbs.      Objective:   Physical Exam VS: see vs page GEN: no distress HEAD: head: no deformity eyes: no periorbital swelling, but there is bilat proptosis external nose and ears are normal NECK: supple, thyroid is not enlarged CHEST WALL: no deformity LUNGS: clear to auscultation CV: reg rate and rhythm, no murmur.  MUSCULOSKELETAL: gait is normal and steady EXTEMITIES: no deformity.  no leg edema.   NEURO:  readily moves all 4's.  sensation is intact to touch on all 4's.  Slight  tremor of the hands.   SKIN:  Normal texture and temperature.  No rash or suspicious lesion is visible.  Not diaphoretic.   NODES:  None palpable at the neck PSYCH: alert, well-oriented.  Does not appear anxious nor depressed.    outside test results are reviewed: TSH is undetectable Free T4 is elevated  I have reviewed outside records, and summarized: Pt was noted to have abnormal TFT, and referred here.  She was also noted to have HTN.  Inderal was rx'ed, to help both probs.       Assessment & Plan:  Hyperthyroidism, uncontrolled.   Patient Instructions  Please continue the same propranolol I have sent a prescription to your pharmacy, to increase the methimazole. If ever you have fever while taking methimazole, stop it and call , even if the reason is obvious, because of the risk of a rare side-effect.   It is best to never miss the methimazole.  However, if you do miss it, next best is to double up the next time.    Please come back for a follow-up appointment in 3 weeks, but video if you prefer.

## 2020-12-24 ENCOUNTER — Encounter: Payer: Self-pay | Admitting: Endocrinology

## 2020-12-26 DIAGNOSIS — E0590 Thyrotoxicosis, unspecified without thyrotoxic crisis or storm: Secondary | ICD-10-CM | POA: Insufficient documentation

## 2021-01-08 ENCOUNTER — Telehealth (INDEPENDENT_AMBULATORY_CARE_PROVIDER_SITE_OTHER): Payer: No Typology Code available for payment source | Admitting: Endocrinology

## 2021-01-08 ENCOUNTER — Other Ambulatory Visit: Payer: Self-pay

## 2021-01-08 VITALS — BP 132/88 | Ht 65.0 in | Wt 263.0 lb

## 2021-01-08 DIAGNOSIS — E0590 Thyrotoxicosis, unspecified without thyrotoxic crisis or storm: Secondary | ICD-10-CM | POA: Diagnosis not present

## 2021-01-08 MED ORDER — PROPRANOLOL HCL 10 MG PO TABS: 10.0000 mg | ORAL_TABLET | Freq: Three times a day (TID) | ORAL | 2 refills | Status: AC

## 2021-01-08 NOTE — Patient Instructions (Addendum)
Please continue the same propranolol I have sent a prescription to your pharmacy, to increase the methimazole. If ever you have fever while taking methimazole, stop it and call us, even if the reason is obvious, because of the risk of a rare side-effect.   It is best to never miss the methimazole.  However, if you do miss it, next best is to double up the next time.    I have sent a prescription to your pharmacy, to increase the inderal to TID.   Please come back for a follow-up appointment in 1 month.

## 2021-01-08 NOTE — Progress Notes (Signed)
Subjective:    Patient ID: Karen Macias, female    DOB: 12-Mar-1981, 40 y.o.   MRN: [id number]  HPI telehealth visit today via video visit.  Alternatives to telehealth are presented to this patient, and the patient agrees to the telehealth visit.  Pt is advised of the cost of the visit, and agrees to this, also.   Patient is at home, and I am at the office.    Persons attending the telehealth visit: the patient and I.   Pt returns for f/u of hyperthyroidism (dx'ed 2022; she has never had thyroid imaging; tapazole was chosen as rx, due to severity).  Since on the increased tapazole, she feels no better.  She still has palpitations, anxiety, hair loss, and diaphoresis.  Pt says her heart rate is 87.   Past Medical History:  Diagnosis Date  . Avascular necrosis (HCC)    RT HIP  . Complication of anesthesia   . Headache(784.0)    HISTORY OF MIGRAINES  . Insomnia   . PONV (postoperative nausea and vomiting)     Past Surgical History:  Procedure Laterality Date  . APPENDECTOMY    . CHOLECYSTECTOMY    . DECOMPRESSION HIP-CORE Right 04/05/2013   Procedure: RIGHT HIP CORE - DECOMPRESSION;  Surgeon: Shelda Pal, MD;  Location: [facility];  Service: Orthopedics;  Laterality: Right;  . TOTAL HIP ARTHROPLASTY Right 04/08/2014   Procedure: RIGHT TOTAL HIP ARTHROPLASTY ANTERIOR APPROACH;  Surgeon: Shelda Pal, MD;  Location: [facility];  Service: Orthopedics;  Laterality: Right;  . TUBAL LIGATION      Social History   Socioeconomic History  . Marital status: Single    Spouse name: Not on file  . Number of children: Not on file  . Years of education: Not on file  . Highest education level: Not on file  Occupational History  . Not on file  Tobacco Use  . Smoking status: Never Smoker  . Smokeless tobacco: Never Used  Substance and Sexual Activity  . Alcohol use: No  . Drug use: No  . Sexual activity: Not on file  Other Topics Concern  . Not on file  Social History Narrative  .  Not on file   Social Determinants of Health   Financial Resource Strain: Not on file  Food Insecurity: Not on file  Transportation Needs: Not on file  Physical Activity: Not on file  Stress: Not on file  Social Connections: Not on file  Intimate Partner Violence: Not on file    Current Outpatient Medications on File Prior to Visit  Medication Sig Dispense Refill  . ferrous sulfate 325 (65 FE) MG tablet Take 1 tablet (325 mg total) by mouth 3 (three) times daily after meals.  3  . methimazole (TAPAZOLE) 10 MG tablet Take 3 tablets (30 mg total) by mouth 2 (two) times daily. 180 tablet 3  . zolpidem (AMBIEN) 10 MG tablet Take 10 mg by mouth at bedtime as needed for sleep.    Marland Kitchen docusate sodium 100 MG CAPS Take 100 mg by mouth 2 (two) times daily. 10 capsule 0  . methocarbamol (ROBAXIN) 500 MG tablet Take 1 tablet (500 mg total) by mouth every 6 (six) hours as needed for muscle spasms. 50 tablet 0  . oxyCODONE (OXY IR/ROXICODONE) 5 MG immediate release tablet Take 1-3 tablets (5-15 mg total) by mouth every 4 (four) hours as needed for severe pain. 120 tablet 0  . polyethylene glycol (MIRALAX / GLYCOLAX) packet Take 17 g  by mouth 2 (two) times daily. 14 each 0   No current facility-administered medications on file prior to visit.    Allergies  Allergen Reactions  . Darvocet [Propoxyphene N-Acetaminophen] Hives    Family History  Problem Relation Age of Onset  . Thyroid disease Neg Hx     BP 132/88 (BP Location: Left Arm, Patient Position: Sitting, Cuff Size: Large)   Ht 5\' 5"  (1.651 m)   Wt 263 lb (119.3 kg)   BMI 43.77 kg/m   Review of Systems Denies fever    Objective:   Physical Exam   Lab Results  Component Value Date   TSH <0.005 (L) 01/08/2021       Assessment & Plan:  Hyperthyroidism, uncontrolled despite tapazole.  Please continue the same tapazole for now. I advised surgery or RAI.  Pt will consider.

## 2021-01-09 ENCOUNTER — Encounter: Payer: Self-pay | Admitting: Endocrinology

## 2021-01-09 LAB — TSH: TSH: <0.005 u[IU]/mL — ABNORMAL LOW (ref 0.450–4.500)

## 2021-01-09 LAB — T4, FREE: Free T4: 2.41 ng/dL — ABNORMAL HIGH (ref 0.82–1.77)

## 2021-01-11 ENCOUNTER — Encounter: Payer: Self-pay | Admitting: Endocrinology

## 2021-01-11 ENCOUNTER — Other Ambulatory Visit: Payer: Self-pay | Admitting: Endocrinology

## 2021-01-11 DIAGNOSIS — E0590 Thyrotoxicosis, unspecified without thyrotoxic crisis or storm: Secondary | ICD-10-CM

## 2021-01-11 NOTE — Telephone Encounter (Signed)
Pt is aware that the Methimzole is at the pharmacy.

## 2021-01-13 NOTE — Telephone Encounter (Signed)
Hey Lavone Neri,   Could help this patient or sent it to someone who can?  Thanks

## 2021-01-14 NOTE — Telephone Encounter (Signed)
Just called patient to update her on referral process. Informed her that I was sending her referral to [facility] now and they will call her to schedule.  I asked her to please call and ask for me if she has not heard from them by Tuesday and I would call to follow up with them for her. FYI

## 2021-02-10 ENCOUNTER — Ambulatory Visit: Payer: Self-pay | Admitting: General Surgery

## 2021-02-10 NOTE — H&P (Signed)
History of Present Illness Axel Filler MD; 02/10/2021 4:33 PM) The patient is a 40 year old female who presents with a complaint of hyperthyroidism. Patient is a 40 year old female, comes in secondary hyperparathyroidism.  Patient follows back up in is referred by Dr. Everardo All.  Patient recently states that she's had some symptoms of sweating, palpitation, weight loss, hair loss since February. Looking back she feels that this is been going on over the last year. Patient has been tried on Tapazole with minimal success. She feels that she does not want to continue with ongoing medication has not been working for her. Patient was counseled on radioactive iodine versus surgery. Patient feels that surgery would work better for her at this time. Patient's most recent thyroid studies are below. Patient with a normal calcium level.    TSH 0.450 - 4.500 uIU/mL <0.005 Low   Free T4 0.82 - 1.77 ng/dL 1.61 High      Past Surgical History Zigmund Gottron Marshall-McBride, CMA; 02/10/2021 4:11 PM) Appendectomy  Hip Surgery  Right.  Diagnostic Studies History Mertha Finders, CMA; 02/10/2021 4:11 PM) Colonoscopy  never Mammogram  never Pap Smear  1-5 years ago  Allergies (Kheana Marshall-McBride, CMA; 02/10/2021 4:12 PM) No Known Drug Allergies  [02/10/2021]: No Known Allergies  [02/10/2021]: Allergies Reconciled   Medication History (Kheana Marshall-McBride, CMA; 02/10/2021 4:21 PM) Escitalopram Oxalate (5MG  Tablet, Oral) Active. Vitamin D (Ergocalciferol) (1.25 MG(50000 UT) Capsule, Oral) Active. amLODIPine Besylate (5MG  Tablet, Oral) Active. Propranolol HCl (10MG  Tablet, Oral) Active. methIMAzole (5MG  Tablet, Oral) Active. Gabapentin (300MG  Capsule, Oral) Active. Zolpidem Tartrate (10MG  Tablet, Oral) Active. Chlorthalidone (25MG  Tablet, Oral) Active. Spironolactone (50MG  Tablet, Oral) Active. Medications Reconciled  Social History ,  CMA; 02/10/2021 4:11 PM) Alcohol use  Occasional alcohol use. No caffeine use  No drug use  Tobacco use  Never smoker.  Family History , CMA; 02/10/2021 4:11 PM) Colon Cancer  Mother. Hypertension  Father, Mother.  Pregnancy / Birth History , CMA; 02/10/2021 4:11 PM) Age at menarche  12 years. Gravida  1 Maternal age  1-25 Para  1 Regular periods   Other Problems , CMA; 02/10/2021 4:11 PM) No pertinent past medical history     Review of Systems 02/12/2021 Marshall-McBride CMA; 02/10/2021 4:11 PM) General Present- Fatigue, Night Sweats, Weight Gain and Weight Loss. Not Present- Appetite Loss, Chills and Fever. Skin Not Present- Change in Wart/Mole, Dryness, Hives, Jaundice, New Lesions, Non-Healing Wounds, Rash and Ulcer. HEENT Present- Visual Disturbances. Not Present- Earache, Hearing Loss, Hoarseness, Nose Bleed, Oral Ulcers, Ringing in the Ears, Seasonal Allergies, Sinus Pain, Sore Throat, Wears glasses/contact lenses and Yellow Eyes. Respiratory Not Present- Bloody sputum, Chronic Cough, Difficulty Breathing, Snoring and Wheezing. Breast Not Present- Breast Mass, Breast Pain, Nipple Discharge and Skin Changes. Cardiovascular Present- Palpitations, Rapid Heart Rate and Shortness of Breath. Not Present- Chest Pain, Difficulty Breathing Lying Down, Leg Cramps and Swelling of Extremities. Gastrointestinal Present- Change in Bowel Habits and Nausea. Not Present- Abdominal Pain, Bloating, Bloody Stool, Chronic diarrhea, Constipation, Difficulty Swallowing, Excessive gas, Gets full quickly at meals, Hemorrhoids, Indigestion, Rectal Pain and Vomiting. Female Genitourinary Not Present- Frequency, Nocturia, Painful Urination, Pelvic Pain and Urgency. Musculoskeletal Present- Back Pain, Joint Pain and Muscle Weakness. Not Present- Joint Stiffness, Muscle Pain and Swelling of Extremities. Neurological Present-  Headaches, Tremor and Weakness. Not Present- Decreased Memory, Fainting, Numbness, Seizures, Tingling and Trouble walking. Psychiatric Present- Anxiety and Change in Sleep Pattern. Not Present- Bipolar, Depression, Fearful and Frequent crying. Endocrine Present- Excessive  Hunger and Hair Changes. Not Present- Cold Intolerance, Heat Intolerance, Hot flashes and New Diabetes. Hematology Not Present- Blood Thinners, Easy Bruising, Excessive bleeding, Gland problems, HIV and Persistent Infections.  Vitals (Kheana Marshall-McBride CMA; 02/10/2021 4:23 PM) 02/10/2021 4:22 PM Weight: 274.5 lb Height: 65in Body Surface Area: 2.26 m Body Mass Index: 45.68 kg/m  Temp.: 97.68F  Pulse: 120 (Regular)  P.OX: 98% (Room air) BP: 126/84(Sitting, Left Arm, Standard)       Physical Exam Axel Filler MD; 02/10/2021 4:33 PM) The physical exam findings are as follows: Note: Constitutional: No acute distress, conversant, appears stated age  Eyes: Anicteric sclerae, moist conjunctiva, no lid lag  Neck: trachea midline, no cervical lymphadenopathy, patient with some minimal thyromegaly.  Lungs: Clear to auscultation biilaterally, normal respiratory effot  Cardiovascular: regular rate & rhythm, no murmurs, no peripheal edema, pedal pulses 2+  GI: Soft, no masses or hepatosplenomegaly, non-tender to palpation  MSK: Normal gait, no clubbing cyanosis, edema  Skin: No rashes, palpation reveals normal skin turgor  Psychiatric: Appropriate judgment and insight, oriented to person, place, and time    Assessment & Plan Axel Filler MD; 02/10/2021 4:34 PM) HYPERTHYROIDISM (E05.90) Impression: Patient is a 40 year old female with a history of hypothyroidism. Had a long discussion with the patient regards of nonoperative and operative treatment. Patient at this time will like to proceed with total thyroidectomy. I discussed with her the surgery in detail. I discussed with her the risks and  benefits of the procedure to include but not limited to: Infection, bleeding, damage to show any structures to include the recurrent laryngeal nerves, possible need for tracheostomy, possible need for further surgery. Patient voiced understanding and wishes to proceed.

## 2023-06-16 NOTE — ED Provider Notes (Signed)
 Patient placed in First Look pathway, seen and evaluated for chief complaint of sob and left calf pain for the last two days. Was sent by UC for dvt/pe r/o. No hx of clots. Pertinent exam findings include non-toxic in appearance. Based on initial evaluation, labs are currently indicated and radiology studies are currently indicated as allowed for current processes and treatments as applicable in a triage setting and could be different than if patient were seen in a main treatment area or dependent on labs/imagining after results are displayed.  Patient counseled on process, plan, and necessity for staying for completing the evaluation.   This document serves as a record of services personally performed by Tamea Ford PA-C.   [facility] Emergency Department Provider Note  MRN: [id number] Chief Complaint:  Chief Complaint  Patient presents with   Leg Pain         History and Review of Systems  Karen Macias is a 42 y.o. year-old female with a medical history discussed below presenting to the ED with chief complaint of pain to the posterior left thigh and left calf.  Patient was sent to our emergency department from urgent care to rule out a deep vein thrombosis.  She reports that over the past 2 days she has had pain with ambulation and swelling to the left calf.  Additionally she has had some mild shortness of breath.  She reports that her symptoms started after she struck the left knee on bed railing.  She denies that she has had chest pains.  Additionally she characterizes her pain as more of a cramping type pain located to the hamstrings and to the gastroc over the left calf.  Though documented above the patient denies that she appreciates any erythematous changes in comparison from left or right legs.  Additionally she denies that she has had surgery within the past 4 weeks denies hemoptysis denies immobilization not currently on hormonal therapy.  Review of Systems A  pertinent review of systems was obtained and was negative except as noted in the HPI and MDM.    Medical Decision Making  Past medical/surgical history that increases complexity of ED encounter: Graves' disease  On initial evaluation: Hemodynamically stable, afebrile, and nontoxic-appearing.   Ddx: includes but not limited to: Deep vein thrombosis, cellulitis, pulmonary emboli, muscle strain, pneumonia, pneumothorax, costochondritis.  CTA PE study was ordered by personal provider however the patient is not experiencing any shortness of breath during my evaluation and is not having chest pain.  Additionally she is PERC negative.  So I canceled the CT PE study and ordered chest x-ray.  Interpretation of Diagnostics: I personally reviewed the patient's ekg/lab tests/imaging and my interpretation is as follows:   Her EKG was remarkable for sinus rhythm with ventricular rate of 84 bpm no appreciable ST changes.  Ultrasound of the left leg revealed no evidence of DVT.  TSH and T4 were within normal limits.  Troponins negative x 2.  Metabolic panel reassuring.  CBC without leukocytosis anemia or thrombocytopenia.  After given the patient a Toradol  shot and Robaxin  she reported that her symptoms had improved tremendously.  On palpation of her legs she felt like it was actually less swollen.  I suspect that the patient likely was having muscle spasms after striking her leg against furniture recently.  I recommended to the patient that she continue to take these medications and to use supportive therapy at home.  If she were to have recurrence of chest pain or shortness of  breath I encouraged her to come back to the emergency department for me to evaluate her.   Patient's presentation is most consistent with acute presentation with potential threat to life or bodily function.  Further history obtained from: Family Member  and PCP Note as stated in MDM/HPI.  Factors Impacting ED Encounter  Risk: Discussion regarding prescription drug management  and Access to health care services  Physical Exam   Gen: A&O Non-toxic  HEENT: NCAT, EOMI, external ears normal.  Neck: Supple  Lungs: No Respiratory distress.  Clear to auscultation bilaterally  CV: RRR.  No appreciable swelling in comparison from left leg to right leg.,  No murmurs rubs or gallops  Abdominal: Non-distended  MSK: atraumatic, tenderness to palpation over the posterior aspect of the left leg in the areas of the quadriceps and gastroc  Skin: No rashes, left leg does not appear to be more erythematous in comparison to right leg.  Neuro: Grossly intact.    Procedures   Procedures  ED MEDS: Medications  ketorolac  (TORADOL ) injection 15 mg (15 mg intramuscular Given 06/16/23 1725)  methocarbamoL  (ROBAXIN ) tablet 500 mg (500 mg oral Given 06/16/23 1725)     No data recorded     Glasgow Coma Scale Score: 15                  Final Clinical Impressions(s) 1. Muscle spasm     ED Disposition:  Discharge   ED Prescriptions     Medication Sig Dispense Start Date End Date Auth. Provider   cyclobenzaprine (FLEXERIL) 10 mg tablet Take 1 tablet (10 mg total) by mouth 2 (two) times a day as needed for muscle spasms for up to 10 days. 20 tablet 06/16/2023 06/26/2023 Fairy Jayson Key, MD   diclofenac (VOLTAREN) 50 mg EC tablet Take 1 tablet (50 mg total) by mouth 2 (two) times a day for 10 days. 20 tablet 06/16/2023 06/26/2023 Fairy Jayson Key, MD

## 2024-04-11 ENCOUNTER — Other Ambulatory Visit: Payer: Self-pay | Admitting: Nurse Practitioner

## 2024-04-11 DIAGNOSIS — H0589 Other disorders of orbit: Secondary | ICD-10-CM

## 2024-04-23 ENCOUNTER — Other Ambulatory Visit

## 2024-11-13 ENCOUNTER — Other Ambulatory Visit: Payer: Self-pay

## 2024-11-13 ENCOUNTER — Emergency Department (HOSPITAL_COMMUNITY)
Admission: EM | Admit: 2024-11-13 | Discharge: 2024-11-13 | Disposition: A | Discharge status: Home / Self Care | Source: Home / Self Care | Attending: Emergency Medicine | Admitting: Emergency Medicine

## 2024-11-13 ENCOUNTER — Emergency Department (HOSPITAL_COMMUNITY)

## 2024-11-13 ENCOUNTER — Encounter (HOSPITAL_COMMUNITY): Payer: Self-pay

## 2024-11-13 DIAGNOSIS — Y92410 Unspecified street and highway as the place of occurrence of the external cause: Secondary | ICD-10-CM | POA: Diagnosis not present

## 2024-11-13 DIAGNOSIS — S40012A Contusion of left shoulder, initial encounter: Secondary | ICD-10-CM | POA: Diagnosis not present

## 2024-11-13 DIAGNOSIS — S7002XA Contusion of left hip, initial encounter: Secondary | ICD-10-CM | POA: Insufficient documentation

## 2024-11-13 DIAGNOSIS — R072 Precordial pain: Secondary | ICD-10-CM | POA: Diagnosis not present

## 2024-11-13 DIAGNOSIS — Z79899 Other long term (current) drug therapy: Secondary | ICD-10-CM | POA: Diagnosis not present

## 2024-11-13 DIAGNOSIS — R000 Tachycardia, unspecified: Secondary | ICD-10-CM | POA: Insufficient documentation

## 2024-11-13 DIAGNOSIS — S0990XA Unspecified injury of head, initial encounter: Secondary | ICD-10-CM | POA: Insufficient documentation

## 2024-11-13 DIAGNOSIS — S8002XA Contusion of left knee, initial encounter: Secondary | ICD-10-CM | POA: Insufficient documentation

## 2024-11-13 DIAGNOSIS — M542 Cervicalgia: Secondary | ICD-10-CM | POA: Insufficient documentation

## 2024-11-13 DIAGNOSIS — R079 Chest pain, unspecified: Secondary | ICD-10-CM

## 2024-11-13 DIAGNOSIS — I10 Essential (primary) hypertension: Secondary | ICD-10-CM | POA: Diagnosis not present

## 2024-11-13 DIAGNOSIS — S79912A Unspecified injury of left hip, initial encounter: Secondary | ICD-10-CM | POA: Diagnosis present

## 2024-11-13 MED ORDER — ONDANSETRON HCL 4 MG/2ML IJ SOLN
4.0000 mg | Freq: Once | INTRAMUSCULAR | Status: AC
  Administered 2024-11-13: 4 mg via INTRAVENOUS
  Filled 2024-11-13: qty 2

## 2024-11-13 MED ORDER — LIDOCAINE 5 % EX PTCH
1.0000 | MEDICATED_PATCH | CUTANEOUS | Status: DC
  Administered 2024-11-13: 1 via TRANSDERMAL
  Filled 2024-11-13: qty 1

## 2024-11-13 MED ORDER — IBUPROFEN 400 MG PO TABS
600.0000 mg | ORAL_TABLET | Freq: Once | ORAL | Status: AC
  Administered 2024-11-13: 600 mg via ORAL
  Filled 2024-11-13: qty 1

## 2024-11-13 MED ORDER — FENTANYL CITRATE (PF) 50 MCG/ML IJ SOSY
50.0000 ug | PREFILLED_SYRINGE | Freq: Once | INTRAMUSCULAR | Status: AC
  Administered 2024-11-13: 50 ug via INTRAVENOUS
  Filled 2024-11-13: qty 1

## 2024-11-13 MED ORDER — MELOXICAM 7.5 MG PO TABS: 7.5000 mg | ORAL_TABLET | Freq: Every day | ORAL | 0 refills | Status: AC

## 2024-11-13 MED ORDER — METHOCARBAMOL 500 MG PO TABS
500.0000 mg | ORAL_TABLET | Freq: Once | ORAL | Status: AC
  Administered 2024-11-13: 500 mg via ORAL
  Filled 2024-11-13: qty 1

## 2024-11-13 MED ORDER — METHOCARBAMOL 500 MG PO TABS: 500.0000 mg | ORAL_TABLET | Freq: Two times a day (BID) | ORAL | 0 refills | Status: AC

## 2024-11-13 NOTE — ED Notes (Signed)
"  Pt in imaging  "
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
## 2024-11-13 NOTE — ED Notes (Signed)
 Patient transported to CT
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
